# Patient Record
Sex: Female | Born: 1945 | Race: White | Hispanic: No | Marital: Married | Smoking: Never smoker
Health system: Southern US, Community
[De-identification: ages and names within clinical notes are randomized; demographics above are authoritative.]

## PROBLEM LIST (undated history)

## (undated) DIAGNOSIS — M1990 Unspecified osteoarthritis, unspecified site: Secondary | ICD-10-CM

## (undated) DIAGNOSIS — E785 Hyperlipidemia, unspecified: Secondary | ICD-10-CM

## (undated) DIAGNOSIS — I10 Essential (primary) hypertension: Secondary | ICD-10-CM

## (undated) DIAGNOSIS — Z9889 Other specified postprocedural states: Secondary | ICD-10-CM

## (undated) DIAGNOSIS — C50919 Malignant neoplasm of unspecified site of unspecified female breast: Secondary | ICD-10-CM

## (undated) DIAGNOSIS — R112 Nausea with vomiting, unspecified: Secondary | ICD-10-CM

## (undated) HISTORY — PX: MASTECTOMY: SHX3

## (undated) HISTORY — PX: BUNIONECTOMY: SHX129

## (undated) HISTORY — PX: BREAST LUMPECTOMY: SHX2

## (undated) HISTORY — PX: BREAST RECONSTRUCTION: SHX9

## (undated) HISTORY — PX: ABDOMINAL HYSTERECTOMY: SHX81

---

## 2018-09-01 ENCOUNTER — Ambulatory Visit
Admission: RE | Admit: 2018-09-01 | Discharge: 2018-09-01 | Disposition: A | Discharge status: Home / Self Care | Payer: Medicare HMO | Source: Ambulatory Visit | Attending: Physical Medicine and Rehabilitation | Admitting: Physical Medicine and Rehabilitation

## 2018-09-01 ENCOUNTER — Other Ambulatory Visit: Payer: Self-pay

## 2018-09-01 ENCOUNTER — Other Ambulatory Visit: Payer: Self-pay | Admitting: Physical Medicine and Rehabilitation

## 2018-09-01 DIAGNOSIS — M5416 Radiculopathy, lumbar region: Secondary | ICD-10-CM | POA: Diagnosis not present

## 2020-01-08 ENCOUNTER — Other Ambulatory Visit: Payer: Self-pay

## 2020-01-08 ENCOUNTER — Encounter: Payer: Self-pay | Admitting: Ophthalmology

## 2020-01-10 ENCOUNTER — Other Ambulatory Visit: Payer: Self-pay

## 2020-01-10 ENCOUNTER — Other Ambulatory Visit
Admission: RE | Admit: 2020-01-10 | Discharge: 2020-01-10 | Disposition: A | Discharge status: Home / Self Care | Payer: Medicare HMO | Source: Ambulatory Visit | Attending: Ophthalmology | Admitting: Ophthalmology

## 2020-01-10 DIAGNOSIS — Z20822 Contact with and (suspected) exposure to covid-19: Secondary | ICD-10-CM | POA: Insufficient documentation

## 2020-01-10 DIAGNOSIS — Z01812 Encounter for preprocedural laboratory examination: Secondary | ICD-10-CM | POA: Insufficient documentation

## 2020-01-10 LAB — SARS CORONAVIRUS 2 (TAT 6-24 HRS): SARS Coronavirus 2: NEGATIVE

## 2020-01-10 NOTE — Discharge Instructions (Signed)

## 2020-01-14 ENCOUNTER — Ambulatory Visit: Payer: Medicare HMO | Admitting: Anesthesiology

## 2020-01-14 ENCOUNTER — Other Ambulatory Visit: Payer: Self-pay

## 2020-01-14 ENCOUNTER — Encounter
Admission: RE | Disposition: A | Discharge status: Home / Self Care | Payer: Self-pay | Source: Home / Self Care | Attending: Ophthalmology

## 2020-01-14 ENCOUNTER — Encounter: Payer: Self-pay | Admitting: Ophthalmology

## 2020-01-14 ENCOUNTER — Ambulatory Visit
Admission: RE | Admit: 2020-01-14 | Discharge: 2020-01-14 | Disposition: A | Discharge status: Home / Self Care | Payer: Medicare HMO | Attending: Ophthalmology | Admitting: Ophthalmology

## 2020-01-14 DIAGNOSIS — M170 Bilateral primary osteoarthritis of knee: Secondary | ICD-10-CM | POA: Insufficient documentation

## 2020-01-14 DIAGNOSIS — Z853 Personal history of malignant neoplasm of breast: Secondary | ICD-10-CM | POA: Insufficient documentation

## 2020-01-14 DIAGNOSIS — Z79899 Other long term (current) drug therapy: Secondary | ICD-10-CM | POA: Insufficient documentation

## 2020-01-14 DIAGNOSIS — H2512 Age-related nuclear cataract, left eye: Secondary | ICD-10-CM | POA: Diagnosis present

## 2020-01-14 DIAGNOSIS — M479 Spondylosis, unspecified: Secondary | ICD-10-CM | POA: Diagnosis not present

## 2020-01-14 DIAGNOSIS — E785 Hyperlipidemia, unspecified: Secondary | ICD-10-CM | POA: Insufficient documentation

## 2020-01-14 DIAGNOSIS — I10 Essential (primary) hypertension: Secondary | ICD-10-CM | POA: Diagnosis not present

## 2020-01-14 DIAGNOSIS — Z6837 Body mass index (BMI) 37.0-37.9, adult: Secondary | ICD-10-CM | POA: Insufficient documentation

## 2020-01-14 DIAGNOSIS — E669 Obesity, unspecified: Secondary | ICD-10-CM | POA: Diagnosis not present

## 2020-01-14 HISTORY — DX: Essential (primary) hypertension: I10

## 2020-01-14 HISTORY — DX: Malignant neoplasm of unspecified site of unspecified female breast: C50.919

## 2020-01-14 HISTORY — DX: Other specified postprocedural states: R11.2

## 2020-01-14 HISTORY — DX: Other specified postprocedural states: Z98.890

## 2020-01-14 HISTORY — DX: Unspecified osteoarthritis, unspecified site: M19.90

## 2020-01-14 HISTORY — DX: Hyperlipidemia, unspecified: E78.5

## 2020-01-14 HISTORY — PX: CATARACT EXTRACTION W/PHACO: SHX586

## 2020-01-14 SURGERY — PHACOEMULSIFICATION, CATARACT, WITH IOL INSERTION
Anesthesia: Monitor Anesthesia Care | Site: Eye | Laterality: Left

## 2020-01-14 MED ORDER — TETRACAINE HCL 0.5 % OP SOLN
1.0000 [drp] | OPHTHALMIC | Status: DC | PRN
  Administered 2020-01-14 (×3): 1 [drp] via OPHTHALMIC

## 2020-01-14 MED ORDER — SODIUM HYALURONATE 23 MG/ML IO SOLN
INTRAOCULAR | Status: DC | PRN
  Administered 2020-01-14: 0.6 mL via INTRAOCULAR

## 2020-01-14 MED ORDER — EPINEPHRINE PF 1 MG/ML IJ SOLN
INTRAOCULAR | Status: DC | PRN
  Administered 2020-01-14: 86 mL via OPHTHALMIC

## 2020-01-14 MED ORDER — MIDAZOLAM HCL 2 MG/2ML IJ SOLN
INTRAMUSCULAR | Status: DC | PRN
  Administered 2020-01-14 (×2): 1 mg via INTRAVENOUS

## 2020-01-14 MED ORDER — LIDOCAINE HCL (PF) 2 % IJ SOLN
INTRAOCULAR | Status: DC | PRN
  Administered 2020-01-14: 1 mL via INTRAOCULAR

## 2020-01-14 MED ORDER — ARMC OPHTHALMIC DILATING DROPS
1.0000 "application " | OPHTHALMIC | Status: DC | PRN
  Administered 2020-01-14 (×3): 1 via OPHTHALMIC

## 2020-01-14 MED ORDER — MOXIFLOXACIN HCL 0.5 % OP SOLN
OPHTHALMIC | Status: DC | PRN
  Administered 2020-01-14: 0.2 mL via OPHTHALMIC

## 2020-01-14 MED ORDER — SODIUM HYALURONATE 10 MG/ML IO SOLN
INTRAOCULAR | Status: DC | PRN
  Administered 2020-01-14: 0.55 mL via INTRAOCULAR

## 2020-01-14 MED ORDER — LACTATED RINGERS IV SOLN: INTRAVENOUS | Status: DC

## 2020-01-14 SURGICAL SUPPLY — 19 items
CANNULA ANT/CHMB 27G (MISCELLANEOUS) ×2 IMPLANT
CANNULA ANT/CHMB 27GA (MISCELLANEOUS) ×4 IMPLANT
DISSECTOR HYDRO NUCLEUS 50X22 (MISCELLANEOUS) ×2 IMPLANT
GLOVE SURG LX 7.5 STRW (GLOVE) ×1
GLOVE SURG LX STRL 7.5 STRW (GLOVE) ×1 IMPLANT
GLOVE SURG SYN 8.5  E (GLOVE) ×1
GLOVE SURG SYN 8.5 E (GLOVE) ×1 IMPLANT
GLOVE SURG SYN 8.5 PF PI (GLOVE) ×1 IMPLANT
GOWN STRL REUS W/ TWL LRG LVL3 (GOWN DISPOSABLE) ×2 IMPLANT
GOWN STRL REUS W/TWL LRG LVL3 (GOWN DISPOSABLE) ×4
LENS IOL TECNIS EYHANCE 19.5 (Intraocular Lens) ×1 IMPLANT
MARKER SKIN DUAL TIP RULER LAB (MISCELLANEOUS) ×2 IMPLANT
PACK DR. KING ARMS (PACKS) ×2 IMPLANT
PACK EYE AFTER SURG (MISCELLANEOUS) ×2 IMPLANT
PACK OPTHALMIC (MISCELLANEOUS) ×2 IMPLANT
SYR 3ML LL SCALE MARK (SYRINGE) ×2 IMPLANT
SYR TB 1ML LUER SLIP (SYRINGE) ×2 IMPLANT
WATER STERILE IRR 250ML POUR (IV SOLUTION) ×2 IMPLANT
WIPE NON LINTING 3.25X3.25 (MISCELLANEOUS) ×2 IMPLANT

## 2020-01-14 NOTE — Op Note (Signed)
OPERATIVE NOTE  Melanie Malone [id number] 01/14/2020   PREOPERATIVE DIAGNOSIS:  Nuclear sclerotic cataract left eye.  H25.12   POSTOPERATIVE DIAGNOSIS:    Nuclear sclerotic cataract left eye.     PROCEDURE:  Phacoemusification with posterior chamber intraocular lens placement of the left eye   LENS:   Implant Name Type Inv. Item Serial No. Manufacturer Lot No. LRB No. Used Action  LENS II EYHANCE 19.5 - [id number] Intraocular Lens LENS II EYHANCE 19.5 [id number] JOHNSON   Left 1 Implanted      Procedure(s) with comments: CATARACT EXTRACTION PHACO AND INTRAOCULAR LENS PLACEMENT (IOC) LEFT (Left) - 6.92 0:54.0  DIB00 +19.5   ULTRASOUND TIME: 0 minutes 54 seconds.  CDE 6.92   SURGEON:  Benay Pillow, MD, MPH   ANESTHESIA:  Topical with tetracaine drops augmented with 1% preservative-free intracameral lidocaine.  ESTIMATED BLOOD LOSS: <1 mL   COMPLICATIONS:  None.   DESCRIPTION OF PROCEDURE:  The patient was identified in the holding room and transported to the operating room and placed in the supine position under the operating microscope.  The left eye was identified as the operative eye and it was prepped and draped in the usual sterile ophthalmic fashion.   A 1.0 millimeter clear-corneal paracentesis was made at the 5:00 position. 0.5 ml of preservative-free 1% lidocaine with epinephrine was injected into the anterior chamber.  The anterior chamber was filled with Healon 5 viscoelastic.  A 2.4 millimeter keratome was used to make a near-clear corneal incision at the 2:00 position.  A curvilinear capsulorrhexis was made with a cystotome and capsulorrhexis forceps.  Balanced salt solution was used to hydrodissect and hydrodelineate the nucleus.   Phacoemulsification was then used in stop and chop fashion to remove the lens nucleus and epinucleus.  The remaining cortex was then removed using the irrigation and aspiration handpiece. Healon was then placed into the capsular bag to  distend it for lens placement.  A lens was then injected into the capsular bag.  The remaining viscoelastic was aspirated.   Wounds were hydrated with balanced salt solution.  The anterior chamber was inflated to a physiologic pressure with balanced salt solution.  Intracameral vigamox 0.1 mL undiltued was injected into the eye and a drop placed onto the ocular surface.  No wound leaks were noted.  The patient was taken to the recovery room in stable condition without complications of anesthesia or surgery  Benay Pillow 01/14/2020, 11:55 AM

## 2020-01-14 NOTE — Transfer of Care (Signed)
Immediate Anesthesia Transfer of Care Note  Patient: Melanie Malone  Procedure(s) Performed: CATARACT EXTRACTION PHACO AND INTRAOCULAR LENS PLACEMENT (IOC) LEFT (Left Eye)  Patient Location: PACU  Anesthesia Type: MAC  Level of Consciousness: awake, alert  and patient cooperative  Airway and Oxygen Therapy: Patient Spontanous Breathing and Patient connected to supplemental oxygen  Post-op Assessment: Post-op Vital signs reviewed, Patient's Cardiovascular Status Stable, Respiratory Function Stable, Patent Airway and No signs of Nausea or vomiting  Post-op Vital Signs: Reviewed and stable  Complications: No complications documented.

## 2020-01-14 NOTE — H&P (Signed)
[facility]   Primary Care Physician:  Dion Body, MD Ophthalmologist: Dr. Benay Pillow  Pre-Procedure History & Physical: HPI:  Melanie Malone is a 74 y.o. female here for cataract surgery.   Past Medical History:  Diagnosis Date  . Arthritis    knees, lower back  . Breast CA ([place])   . Hyperlipidemia   . Hypertension   . PONV (postoperative nausea and vomiting)     Past Surgical History:  Procedure Laterality Date  . ABDOMINAL HYSTERECTOMY    . BREAST LUMPECTOMY    . BREAST RECONSTRUCTION Bilateral   . BUNIONECTOMY Right   . MASTECTOMY Bilateral     Prior to Admission medications   Medication Sig Start Date End Date Taking? Authorizing Provider  acetaminophen (TYLENOL) 500 MG tablet Take 500 mg by mouth 2 (two) times daily as needed.   Yes [provider]  GLUCOSAMINE-CHONDROITIN PO Take by mouth daily. [place] Tamaflex   Yes [provider]  hydrochlorothiazide (HYDRODIURIL) 25 MG tablet Take 25 mg by mouth daily.   Yes [provider]  ibuprofen (ADVIL) 800 MG tablet Take 800 mg by mouth 2 (two) times daily as needed.   Yes [provider]  lisinopril (ZESTRIL) 20 MG tablet Take 20 mg by mouth daily.   Yes [provider]  simvastatin (ZOCOR) 40 MG tablet Take 40 mg by mouth daily.   Yes [provider]  vitamin B-12 (CYANOCOBALAMIN) 500 MCG tablet Take 500 mcg by mouth daily.   Yes [provider]  Vitamin D3 (VITAMIN D) 25 MCG tablet Take 1,000 Units by mouth daily.   Yes [provider]  Melatonin 10 MG TABS Take by mouth at bedtime. Patient not taking: Reported on 01/14/2020    [provider]    Allergies as of 12/27/2019  . (Not on File)    History reviewed. No pertinent family history.  Social History   Socioeconomic History  . Marital status: Married    Spouse name: Not on file  . Number of children: Not on file  . Years of education: Not on file  . Highest  education level: Not on file  Occupational History  . Not on file  Tobacco Use  . Smoking status: Never Smoker  . Smokeless tobacco: Never Used  Vaping Use  . Vaping Use: Never used  Substance and Sexual Activity  . Alcohol use: Not Currently  . Drug use: Not on file  . Sexual activity: Not on file  Other Topics Concern  . Not on file  Social History Narrative  . Not on file   Social Determinants of Health   Financial Resource Strain:   . Difficulty of Paying Living Expenses: Not on file  Food Insecurity:   . Worried About Charity fundraiser in the Last Year: Not on file  . Ran Out of Food in the Last Year: Not on file  Transportation Needs:   . Lack of Transportation (Medical): Not on file  . Lack of Transportation (Non-Medical): Not on file  Physical Activity:   . Days of Exercise per Week: Not on file  . Minutes of Exercise per Session: Not on file  Stress:   . Feeling of Stress : Not on file  Social Connections:   . Frequency of Communication with Friends and Family: Not on file  . Frequency of Social Gatherings with Friends and Family: Not on file  . Attends Religious Services: Not on file  . Active Member of  Clubs or Organizations: Not on file  . Attends Archivist Meetings: Not on file  . Marital Status: Not on file  Intimate Partner Violence:   . Fear of Current or Ex-Partner: Not on file  . Emotionally Abused: Not on file  . Physically Abused: Not on file  . Sexually Abused: Not on file    Review of Systems: See HPI, otherwise negative ROS  Physical Exam: BP (!) 130/94   Pulse 79   Temp (!) 97.5 F (36.4 C) (Temporal)   Resp 16   Ht 5\' 4"  (1.626 m)   Wt 98.7 kg   SpO2 97%   BMI 37.33 kg/m  General:   Alert,  pleasant and cooperative in NAD Head:  Normocephalic and atraumatic.  Impression/Plan: Melanie Malone is here for cataract surgery.  Risks, benefits, limitations, and alternatives regarding cataract surgery have been reviewed  with the patient.  Questions have been answered.  All parties agreeable.   Benay Pillow, MD  01/14/2020, 11:19 AM

## 2020-01-14 NOTE — Anesthesia Procedure Notes (Addendum)
Procedure Name: MAC Date/Time: 01/14/2020 11:30 AM Performed by: Georga Bora, CRNA Pre-anesthesia Checklist: Patient identified, Emergency Drugs available, Suction available, Patient being monitored and Timeout performed Oxygen Delivery Method: Nasal cannula Preoxygenation: Pre-oxygenation with 100% oxygen

## 2020-01-14 NOTE — Anesthesia Postprocedure Evaluation (Signed)
Anesthesia Post Note  Patient: Melanie Malone  Procedure(s) Performed: CATARACT EXTRACTION PHACO AND INTRAOCULAR LENS PLACEMENT (IOC) LEFT (Left Eye)     Patient location during evaluation: PACU Anesthesia Type: MAC Level of consciousness: awake and alert Pain management: pain level controlled Vital Signs Assessment: post-procedure vital signs reviewed and stable Respiratory status: spontaneous breathing Cardiovascular status: blood pressure returned to baseline Postop Assessment: no apparent nausea or vomiting, adequate PO intake and no headache Anesthetic complications: no   No complications documented.  Adele Barthel Danyia Borunda

## 2020-01-14 NOTE — Anesthesia Preprocedure Evaluation (Signed)
Anesthesia Evaluation  Patient identified by MRN, date of birth, ID band Patient awake    History of Anesthesia Complications (+) PONV and history of anesthetic complications  Airway Mallampati: II  TM Distance: >3 FB Neck ROM: Full    Dental no notable dental hx.    Pulmonary neg pulmonary ROS,    Pulmonary exam normal        Cardiovascular Exercise Tolerance: Good hypertension, Pt. on medications Normal cardiovascular exam     Neuro/Psych negative neurological ROS     GI/Hepatic negative GI ROS,   Endo/Other  Obesity (BMI 37)  Renal/GU negative Renal ROS     Musculoskeletal   Abdominal   Peds  Hematology negative hematology ROS (+)   Anesthesia Other Findings   Reproductive/Obstetrics                             Anesthesia Physical Anesthesia Plan  ASA: II  Anesthesia Plan: MAC   Post-op Pain Management:    Induction: Intravenous  PONV Risk Score and Plan: 3 and TIVA, Midazolam and Treatment may vary due to age or medical condition  Airway Management Planned: Nasal Cannula and Natural Airway  Additional Equipment: None  Intra-op Plan:   Post-operative Plan:   Informed Consent: I have reviewed the patients History and Physical, chart, labs and discussed the procedure including the risks, benefits and alternatives for the proposed anesthesia with the patient or authorized representative who has indicated his/her understanding and acceptance.       Plan Discussed with: CRNA  Anesthesia Plan Comments:         Anesthesia Quick Evaluation

## 2020-01-15 ENCOUNTER — Encounter: Payer: Self-pay | Admitting: Ophthalmology

## 2020-02-05 ENCOUNTER — Encounter: Payer: Self-pay | Admitting: Ophthalmology

## 2020-02-05 ENCOUNTER — Other Ambulatory Visit: Payer: Self-pay

## 2020-02-07 ENCOUNTER — Other Ambulatory Visit: Payer: Self-pay

## 2020-02-07 ENCOUNTER — Other Ambulatory Visit
Admission: RE | Admit: 2020-02-07 | Discharge: 2020-02-07 | Disposition: A | Discharge status: Home / Self Care | Payer: Medicare HMO | Source: Ambulatory Visit | Attending: Ophthalmology | Admitting: Ophthalmology

## 2020-02-07 DIAGNOSIS — Z01818 Encounter for other preprocedural examination: Secondary | ICD-10-CM | POA: Diagnosis present

## 2020-02-07 DIAGNOSIS — Z20822 Contact with and (suspected) exposure to covid-19: Secondary | ICD-10-CM | POA: Diagnosis not present

## 2020-02-07 LAB — SARS CORONAVIRUS 2 (TAT 6-24 HRS): SARS Coronavirus 2: NEGATIVE

## 2020-02-07 NOTE — Discharge Instructions (Signed)

## 2020-02-11 ENCOUNTER — Other Ambulatory Visit: Payer: Self-pay

## 2020-02-11 ENCOUNTER — Encounter
Admission: RE | Disposition: A | Discharge status: Home / Self Care | Payer: Self-pay | Source: Home / Self Care | Attending: Ophthalmology

## 2020-02-11 ENCOUNTER — Ambulatory Visit: Payer: Medicare HMO | Admitting: Anesthesiology

## 2020-02-11 ENCOUNTER — Encounter: Payer: Self-pay | Admitting: Ophthalmology

## 2020-02-11 ENCOUNTER — Ambulatory Visit
Admission: RE | Admit: 2020-02-11 | Discharge: 2020-02-11 | Disposition: A | Discharge status: Home / Self Care | Payer: Medicare HMO | Attending: Ophthalmology | Admitting: Ophthalmology

## 2020-02-11 DIAGNOSIS — Z853 Personal history of malignant neoplasm of breast: Secondary | ICD-10-CM | POA: Diagnosis not present

## 2020-02-11 DIAGNOSIS — H2511 Age-related nuclear cataract, right eye: Secondary | ICD-10-CM | POA: Diagnosis present

## 2020-02-11 DIAGNOSIS — Z79899 Other long term (current) drug therapy: Secondary | ICD-10-CM | POA: Diagnosis not present

## 2020-02-11 HISTORY — PX: CATARACT EXTRACTION W/PHACO: SHX586

## 2020-02-11 SURGERY — PHACOEMULSIFICATION, CATARACT, WITH IOL INSERTION
Anesthesia: Monitor Anesthesia Care | Site: Eye | Laterality: Right

## 2020-02-11 MED ORDER — FENTANYL CITRATE (PF) 100 MCG/2ML IJ SOLN
INTRAMUSCULAR | Status: DC | PRN
Start: 2020-02-11 — End: 2020-02-11
  Administered 2020-02-11: 50 ug via INTRAVENOUS

## 2020-02-11 MED ORDER — ARMC OPHTHALMIC DILATING DROPS
1.0000 "application " | OPHTHALMIC | Status: DC | PRN
  Administered 2020-02-11 (×3): 1 via OPHTHALMIC

## 2020-02-11 MED ORDER — LIDOCAINE HCL (PF) 2 % IJ SOLN
INTRAOCULAR | Status: DC | PRN
  Administered 2020-02-11: 1 mL via INTRAOCULAR

## 2020-02-11 MED ORDER — ACETAMINOPHEN 325 MG PO TABS: 325.0000 mg | ORAL_TABLET | Freq: Once | ORAL | Status: DC

## 2020-02-11 MED ORDER — MIDAZOLAM HCL 2 MG/2ML IJ SOLN
INTRAMUSCULAR | Status: DC | PRN
  Administered 2020-02-11: 1 mg via INTRAVENOUS

## 2020-02-11 MED ORDER — SODIUM HYALURONATE 10 MG/ML IO SOLN
INTRAOCULAR | Status: DC | PRN
  Administered 2020-02-11: 0.55 mL via INTRAOCULAR

## 2020-02-11 MED ORDER — ONDANSETRON HCL 4 MG/2ML IJ SOLN
INTRAMUSCULAR | Status: DC | PRN
  Administered 2020-02-11: 4 mg via INTRAVENOUS

## 2020-02-11 MED ORDER — LACTATED RINGERS IV SOLN: INTRAVENOUS | Status: DC

## 2020-02-11 MED ORDER — MOXIFLOXACIN HCL 0.5 % OP SOLN
OPHTHALMIC | Status: DC | PRN
  Administered 2020-02-11: 0.2 mL via OPHTHALMIC

## 2020-02-11 MED ORDER — ACETAMINOPHEN 160 MG/5ML PO SOLN: 325.0000 mg | Freq: Once | ORAL | Status: DC

## 2020-02-11 MED ORDER — TETRACAINE HCL 0.5 % OP SOLN
1.0000 [drp] | OPHTHALMIC | Status: DC | PRN
  Administered 2020-02-11 (×3): 1 [drp] via OPHTHALMIC

## 2020-02-11 MED ORDER — EPINEPHRINE PF 1 MG/ML IJ SOLN
INTRAOCULAR | Status: DC | PRN
  Administered 2020-02-11: 112 mL via OPHTHALMIC

## 2020-02-11 MED ORDER — SODIUM HYALURONATE 23 MG/ML IO SOLN
INTRAOCULAR | Status: DC | PRN
  Administered 2020-02-11: 0.6 mL via INTRAOCULAR

## 2020-02-11 SURGICAL SUPPLY — 17 items
CANNULA ANT/CHMB 27GA (MISCELLANEOUS) ×6 IMPLANT
DISSECTOR HYDRO NUCLEUS 50X22 (MISCELLANEOUS) ×3 IMPLANT
GLOVE SURG LX 7.5 STRW (GLOVE) ×2
GLOVE SURG LX STRL 7.5 STRW (GLOVE) ×1 IMPLANT
GLOVE SURG SYN 8.5  E (GLOVE) ×2
GLOVE SURG SYN 8.5 E (GLOVE) ×1 IMPLANT
GOWN STRL REUS W/ TWL LRG LVL3 (GOWN DISPOSABLE) ×2 IMPLANT
GOWN STRL REUS W/TWL LRG LVL3 (GOWN DISPOSABLE) ×6
LENS IOL TECNIS EYHANCE 19.5 (Intraocular Lens) ×3 IMPLANT
MARKER SKIN DUAL TIP RULER LAB (MISCELLANEOUS) ×3 IMPLANT
PACK DR. KING ARMS (PACKS) ×3 IMPLANT
PACK EYE AFTER SURG (MISCELLANEOUS) ×3 IMPLANT
PACK OPTHALMIC (MISCELLANEOUS) ×3 IMPLANT
SYR 3ML LL SCALE MARK (SYRINGE) ×3 IMPLANT
SYR TB 1ML LUER SLIP (SYRINGE) ×3 IMPLANT
WATER STERILE IRR 250ML POUR (IV SOLUTION) ×3 IMPLANT
WIPE NON LINTING 3.25X3.25 (MISCELLANEOUS) ×3 IMPLANT

## 2020-02-11 NOTE — Anesthesia Procedure Notes (Signed)
Date/Time: 02/11/2020 11:40 AM Performed by: Dionne Bucy, CRNA Pre-anesthesia Checklist: Patient identified, Emergency Drugs available, Suction available, Patient being monitored and Timeout performed Oxygen Delivery Method: Nasal cannula Placement Confirmation: positive ETCO2

## 2020-02-11 NOTE — Anesthesia Preprocedure Evaluation (Signed)
Anesthesia Evaluation  Patient identified by MRN, date of birth, ID band Patient awake    Reviewed: Allergy & Precautions, H&P , NPO status , Patient's Chart, lab work & pertinent test results  History of Anesthesia Complications (+) PONV and history of anesthetic complications  Airway Mallampati: II  TM Distance: >3 FB Neck ROM: full    Dental no notable dental hx.    Pulmonary neg pulmonary ROS,    Pulmonary exam normal breath sounds clear to auscultation       Cardiovascular Exercise Tolerance: Good hypertension, Pt. on medications Normal cardiovascular exam Rhythm:regular Rate:Normal     Neuro/Psych negative neurological ROS     GI/Hepatic negative GI ROS,   Endo/Other  Obesity (BMI 37)  Renal/GU negative Renal ROS     Musculoskeletal   Abdominal   Peds  Hematology negative hematology ROS (+)   Anesthesia Other Findings   Reproductive/Obstetrics                             Anesthesia Physical  Anesthesia Plan  ASA: II  Anesthesia Plan: MAC   Post-op Pain Management:    Induction: Intravenous  PONV Risk Score and Plan: 3 and Treatment may vary due to age or medical condition, TIVA, Midazolam and Ondansetron  Airway Management Planned: Nasal Cannula and Natural Airway  Additional Equipment: None  Intra-op Plan:   Post-operative Plan:   Informed Consent: I have reviewed the patients History and Physical, chart, labs and discussed the procedure including the risks, benefits and alternatives for the proposed anesthesia with the patient or authorized representative who has indicated his/her understanding and acceptance.     Dental Advisory Given  Plan Discussed with: CRNA  Anesthesia Plan Comments:         Anesthesia Quick Evaluation

## 2020-02-11 NOTE — Transfer of Care (Signed)
Immediate Anesthesia Transfer of Care Note  Patient: Melanie Malone  Procedure(s) Performed: CATARACT EXTRACTION PHACO AND INTRAOCULAR LENS PLACEMENT (IOC) RIGHT 13.20 01:13.6 (Right Eye)  Patient Location: PACU  Anesthesia Type: MAC  Level of Consciousness: awake, alert  and patient cooperative  Airway and Oxygen Therapy: Patient Spontanous Breathing and Patient connected to supplemental oxygen  Post-op Assessment: Post-op Vital signs reviewed, Patient's Cardiovascular Status Stable, Respiratory Function Stable, Patent Airway and No signs of Nausea or vomiting  Post-op Vital Signs: Reviewed and stable  Complications: No complications documented.

## 2020-02-11 NOTE — Anesthesia Postprocedure Evaluation (Signed)
Anesthesia Post Note  Patient: Melanie Malone  Procedure(s) Performed: CATARACT EXTRACTION PHACO AND INTRAOCULAR LENS PLACEMENT (IOC) RIGHT 13.20 01:13.6 (Right Eye)     Patient location during evaluation: PACU Anesthesia Type: MAC Level of consciousness: awake and alert and oriented Pain management: satisfactory to patient Vital Signs Assessment: post-procedure vital signs reviewed and stable Respiratory status: spontaneous breathing, nonlabored ventilation and respiratory function stable Cardiovascular status: blood pressure returned to baseline and stable Postop Assessment: Adequate PO intake and No signs of nausea or vomiting Anesthetic complications: no   No complications documented.  Raliegh Ip

## 2020-02-11 NOTE — Op Note (Signed)
OPERATIVE NOTE  Melanie Malone [id number] 02/11/2020   PREOPERATIVE DIAGNOSIS:  Nuclear sclerotic cataract right eye.  H25.11   POSTOPERATIVE DIAGNOSIS:    Nuclear sclerotic cataract right eye.     PROCEDURE:  Phacoemusification with posterior chamber intraocular lens placement of the right eye   LENS:   Implant Name Type Inv. Item Serial No. Manufacturer Lot No. LRB No. Used Action  LENS IOL TECNIS EYHANCE 19.5 - [id number] Intraocular Lens LENS IOL TECNIS EYHANCE 19.5 [id number] JOHNSON   Right 1 Implanted       Procedure(s): CATARACT EXTRACTION PHACO AND INTRAOCULAR LENS PLACEMENT (IOC) RIGHT 13.20 01:13.6 (Right)  DIB00 +19.5   ULTRASOUND TIME: 1 minutes 13 seconds.  CDE 13.20   SURGEON:  Benay Pillow, MD, MPH  ANESTHESIOLOGIST: Anesthesiologist: Ronelle Nigh, MD CRNA: Dionne Bucy, CRNA   ANESTHESIA:  Topical with tetracaine drops augmented with 1% preservative-free intracameral lidocaine.  ESTIMATED BLOOD LOSS: less than 1 mL.   COMPLICATIONS:  None.   DESCRIPTION OF PROCEDURE:  The patient was identified in the holding room and transported to the operating room and placed in the supine position under the operating microscope.  The right eye was identified as the operative eye and it was prepped and draped in the usual sterile ophthalmic fashion.   A 1.0 millimeter clear-corneal paracentesis was made at the 10:30 position. 0.5 ml of preservative-free 1% lidocaine with epinephrine was injected into the anterior chamber.  The anterior chamber was filled with Healon 5 viscoelastic.  A 2.4 millimeter keratome was used to make a near-clear corneal incision at the 8:00 position.  A curvilinear capsulorrhexis was made with a cystotome and capsulorrhexis forceps.  Balanced salt solution was used to hydrodissect and hydrodelineate the nucleus.   Phacoemulsification was then used in stop and chop fashion to remove the lens nucleus and epinucleus.  The remaining cortex was then  removed using the irrigation and aspiration handpiece. Healon was then placed into the capsular bag to distend it for lens placement.  A lens was then injected into the capsular bag.  The remaining viscoelastic was aspirated.   Wounds were hydrated with balanced salt solution.  The anterior chamber was inflated to a physiologic pressure with balanced salt solution.   Intracameral vigamox 0.1 mL undiluted was injected into the eye and a drop placed onto the ocular surface.  No wound leaks were noted.  The patient was taken to the recovery room in stable condition without complications of anesthesia or surgery  Benay Pillow 02/11/2020, 12:03 PM

## 2020-02-11 NOTE — H&P (Signed)
[facility]   Primary Care Physician:  Dion Body, MD Ophthalmologist: Dr. Benay Pillow  Pre-Procedure History & Physical: HPI:  Melanie Malone is a 74 y.o. female here for cataract surgery.   Past Medical History:  Diagnosis Date  . Arthritis    knees, lower back  . Breast CA ([place])   . Hyperlipidemia   . Hypertension   . PONV (postoperative nausea and vomiting)     Past Surgical History:  Procedure Laterality Date  . ABDOMINAL HYSTERECTOMY    . BREAST LUMPECTOMY    . BREAST RECONSTRUCTION Bilateral   . BUNIONECTOMY Right   . CATARACT EXTRACTION W/PHACO Left 01/14/2020   Procedure: CATARACT EXTRACTION PHACO AND INTRAOCULAR LENS PLACEMENT ([place]) LEFT;  Surgeon: Eulogio Bear, MD;  Location: [place];  Service: Ophthalmology;  Laterality: Left;  6.92 0:54.0  . MASTECTOMY Bilateral     Prior to Admission medications   Medication Sig Start Date End Date Taking? Authorizing Provider  acetaminophen (TYLENOL) 500 MG tablet Take 500 mg by mouth 2 (two) times daily as needed.   Yes [provider]  GLUCOSAMINE-CHONDROITIN PO Take by mouth daily. [place] Tamaflex   Yes [provider]  hydrochlorothiazide (HYDRODIURIL) 25 MG tablet Take 25 mg by mouth daily.   Yes [provider]  ibuprofen (ADVIL) 800 MG tablet Take 800 mg by mouth 2 (two) times daily as needed.   Yes [provider]  lisinopril (ZESTRIL) 20 MG tablet Take 20 mg by mouth daily.   Yes [provider]  Melatonin 10 MG TABS Take by mouth at bedtime.    Yes [provider]  simvastatin (ZOCOR) 40 MG tablet Take 40 mg by mouth daily.   Yes [provider]  vitamin B-12 (CYANOCOBALAMIN) 500 MCG tablet Take 500 mcg by mouth daily.   Yes [provider]  Vitamin D3 (VITAMIN D) 25 MCG tablet Take 1,000 Units by mouth daily.   Yes [provider]    Allergies as of 01/16/2020 - Review Complete 01/14/2020  Allergen  Reaction Noted  . Codeine Shortness Of Breath 01/08/2020  . Scallops [shellfish allergy] Nausea And Vomiting 01/08/2020  . Red dye Rash 01/08/2020    History reviewed. No pertinent family history.  Social History   Socioeconomic History  . Marital status: Married    Spouse name: Not on file  . Number of children: Not on file  . Years of education: Not on file  . Highest education level: Not on file  Occupational History  . Not on file  Tobacco Use  . Smoking status: Never Smoker  . Smokeless tobacco: Never Used  Vaping Use  . Vaping Use: Never used  Substance and Sexual Activity  . Alcohol use: Not Currently  . Drug use: Not on file  . Sexual activity: Not on file  Other Topics Concern  . Not on file  Social History Narrative  . Not on file   Social Determinants of Health   Financial Resource Strain:   . Difficulty of Paying Living Expenses: Not on file  Food Insecurity:   . Worried About Charity fundraiser in the Last Year: Not on file  . Ran Out of Food in the Last Year: Not on file  Transportation Needs:   . Lack of Transportation (Medical): Not on file  . Lack of Transportation (Non-Medical): Not on file  Physical Activity:   . Days of Exercise per Week: Not on file  . Minutes of Exercise  per Session: Not on file  Stress:   . Feeling of Stress : Not on file  Social Connections:   . Frequency of Communication with Friends and Family: Not on file  . Frequency of Social Gatherings with Friends and Family: Not on file  . Attends Religious Services: Not on file  . Active Member of Clubs or Organizations: Not on file  . Attends Archivist Meetings: Not on file  . Marital Status: Not on file  Intimate Partner Violence:   . Fear of Current or Ex-Partner: Not on file  . Emotionally Abused: Not on file  . Physically Abused: Not on file  . Sexually Abused: Not on file    Review of Systems: See HPI, otherwise negative ROS  Physical Exam: BP 134/81    Pulse 85   Temp (!) 97 F (36.1 C) (Temporal)   Resp 16   Ht 5\' 4"  (1.626 m)   Wt 99.3 kg   SpO2 98%   BMI 37.59 kg/m  General:   Alert,  pleasant and cooperative in NAD Head:  Normocephalic and atraumatic. Respiratory:  Normal work of breathing.  Impression/Plan: Melanie Malone is here for cataract surgery.  Risks, benefits, limitations, and alternatives regarding cataract surgery have been reviewed with the patient.  Questions have been answered.  All parties agreeable.   Benay Pillow, MD  02/11/2020, 11:31 AM

## 2020-02-12 ENCOUNTER — Encounter: Payer: Self-pay | Admitting: Ophthalmology

## 2020-08-27 ENCOUNTER — Emergency Department: Payer: Medicare HMO

## 2020-08-27 ENCOUNTER — Other Ambulatory Visit: Payer: Self-pay

## 2020-08-27 ENCOUNTER — Inpatient Hospital Stay
Admission: EM | Admit: 2020-08-27 | Discharge: 2020-09-03 | DRG: 478 | Disposition: A | Discharge status: Skilled Nursing Facility | Payer: Medicare HMO | Attending: Internal Medicine | Admitting: Internal Medicine

## 2020-08-27 DIAGNOSIS — Z79899 Other long term (current) drug therapy: Secondary | ICD-10-CM

## 2020-08-27 DIAGNOSIS — Z6836 Body mass index (BMI) 36.0-36.9, adult: Secondary | ICD-10-CM

## 2020-08-27 DIAGNOSIS — Z20822 Contact with and (suspected) exposure to covid-19: Secondary | ICD-10-CM | POA: Diagnosis present

## 2020-08-27 DIAGNOSIS — Z923 Personal history of irradiation: Secondary | ICD-10-CM

## 2020-08-27 DIAGNOSIS — Z853 Personal history of malignant neoplasm of breast: Secondary | ICD-10-CM

## 2020-08-27 DIAGNOSIS — E871 Hypo-osmolality and hyponatremia: Secondary | ICD-10-CM | POA: Diagnosis present

## 2020-08-27 DIAGNOSIS — Z9102 Food additives allergy status: Secondary | ICD-10-CM | POA: Diagnosis not present

## 2020-08-27 DIAGNOSIS — E785 Hyperlipidemia, unspecified: Secondary | ICD-10-CM | POA: Diagnosis present

## 2020-08-27 DIAGNOSIS — Z885 Allergy status to narcotic agent status: Secondary | ICD-10-CM

## 2020-08-27 DIAGNOSIS — S7221XA Displaced subtrochanteric fracture of right femur, initial encounter for closed fracture: Secondary | ICD-10-CM | POA: Diagnosis present

## 2020-08-27 DIAGNOSIS — L8990 Pressure ulcer of unspecified site, unspecified stage: Secondary | ICD-10-CM | POA: Insufficient documentation

## 2020-08-27 DIAGNOSIS — E876 Hypokalemia: Secondary | ICD-10-CM | POA: Diagnosis present

## 2020-08-27 DIAGNOSIS — Z91013 Allergy to seafood: Secondary | ICD-10-CM

## 2020-08-27 DIAGNOSIS — C7951 Secondary malignant neoplasm of bone: Secondary | ICD-10-CM | POA: Diagnosis present

## 2020-08-27 DIAGNOSIS — K5900 Constipation, unspecified: Secondary | ICD-10-CM | POA: Diagnosis present

## 2020-08-27 DIAGNOSIS — I700 Atherosclerosis of aorta: Secondary | ICD-10-CM | POA: Diagnosis present

## 2020-08-27 DIAGNOSIS — Z419 Encounter for procedure for purposes other than remedying health state, unspecified: Secondary | ICD-10-CM

## 2020-08-27 DIAGNOSIS — I10 Essential (primary) hypertension: Secondary | ICD-10-CM | POA: Diagnosis not present

## 2020-08-27 DIAGNOSIS — M159 Polyosteoarthritis, unspecified: Secondary | ICD-10-CM | POA: Diagnosis present

## 2020-08-27 DIAGNOSIS — I2510 Atherosclerotic heart disease of native coronary artery without angina pectoris: Secondary | ICD-10-CM | POA: Diagnosis present

## 2020-08-27 DIAGNOSIS — Z90710 Acquired absence of both cervix and uterus: Secondary | ICD-10-CM

## 2020-08-27 DIAGNOSIS — Z8249 Family history of ischemic heart disease and other diseases of the circulatory system: Secondary | ICD-10-CM | POA: Diagnosis not present

## 2020-08-27 DIAGNOSIS — S7221XD Displaced subtrochanteric fracture of right femur, subsequent encounter for closed fracture with routine healing: Secondary | ICD-10-CM | POA: Diagnosis not present

## 2020-08-27 DIAGNOSIS — L89152 Pressure ulcer of sacral region, stage 2: Secondary | ICD-10-CM | POA: Diagnosis present

## 2020-08-27 DIAGNOSIS — E669 Obesity, unspecified: Secondary | ICD-10-CM | POA: Diagnosis present

## 2020-08-27 DIAGNOSIS — M84451A Pathological fracture, right femur, initial encounter for fracture: Principal | ICD-10-CM | POA: Diagnosis present

## 2020-08-27 DIAGNOSIS — M899 Disorder of bone, unspecified: Secondary | ICD-10-CM | POA: Diagnosis present

## 2020-08-27 DIAGNOSIS — C50919 Malignant neoplasm of unspecified site of unspecified female breast: Secondary | ICD-10-CM | POA: Diagnosis not present

## 2020-08-27 DIAGNOSIS — Z9013 Acquired absence of bilateral breasts and nipples: Secondary | ICD-10-CM | POA: Diagnosis not present

## 2020-08-27 DIAGNOSIS — Z9221 Personal history of antineoplastic chemotherapy: Secondary | ICD-10-CM | POA: Diagnosis not present

## 2020-08-27 DIAGNOSIS — I119 Hypertensive heart disease without heart failure: Secondary | ICD-10-CM | POA: Diagnosis present

## 2020-08-27 LAB — CBC WITH DIFFERENTIAL/PLATELET
Abs Immature Granulocytes: 0.04 10*3/uL (ref 0.00–0.07)
Basophils Absolute: 0 10*3/uL (ref 0.0–0.1)
Basophils Relative: 0 %
Eosinophils Absolute: 0.1 10*3/uL (ref 0.0–0.5)
Eosinophils Relative: 1 %
HCT: 40 % (ref 36.0–46.0)
Hemoglobin: 13.6 g/dL (ref 12.0–15.0)
Immature Granulocytes: 0 %
Lymphocytes Relative: 18 %
Lymphs Abs: 1.9 10*3/uL (ref 0.7–4.0)
MCH: 30.4 pg (ref 26.0–34.0)
MCHC: 34 g/dL (ref 30.0–36.0)
MCV: 89.3 fL (ref 80.0–100.0)
Monocytes Absolute: 1 10*3/uL (ref 0.1–1.0)
Monocytes Relative: 9 %
Neutro Abs: 7.4 10*3/uL (ref 1.7–7.7)
Neutrophils Relative %: 72 %
Platelets: 335 10*3/uL (ref 150–400)
RBC: 4.48 MIL/uL (ref 3.87–5.11)
RDW: 13.2 % (ref 11.5–15.5)
WBC: 10.3 10*3/uL (ref 4.0–10.5)
nRBC: 0 % (ref 0.0–0.2)

## 2020-08-27 LAB — TYPE AND SCREEN
ABO/RH(D): A POS
Antibody Screen: NEGATIVE

## 2020-08-27 LAB — BASIC METABOLIC PANEL
Anion gap: 14 (ref 5–15)
BUN: 22 mg/dL (ref 8–23)
CO2: 21 mmol/L — ABNORMAL LOW (ref 22–32)
Calcium: 9.7 mg/dL (ref 8.9–10.3)
Chloride: 99 mmol/L (ref 98–111)
Creatinine, Ser: 0.76 mg/dL (ref 0.44–1.00)
GFR, Estimated: >60 mL/min (ref >60)
Glucose, Bld: 113 mg/dL — ABNORMAL HIGH (ref 70–99)
Potassium: 3.2 mmol/L — ABNORMAL LOW (ref 3.5–5.1)
Sodium: 134 mmol/L — ABNORMAL LOW (ref 135–145)

## 2020-08-27 LAB — RESP PANEL BY RT-PCR (FLU A&B, COVID) ARPGX2
Influenza A by PCR: NEGATIVE
Influenza B by PCR: NEGATIVE
SARS Coronavirus 2 by RT PCR: NEGATIVE

## 2020-08-27 MED ORDER — CEFAZOLIN SODIUM-DEXTROSE 2-4 GM/100ML-% IV SOLN
2.0000 g | Freq: Once | INTRAVENOUS | Status: AC
  Administered 2020-08-28: 2 g via INTRAVENOUS
  Filled 2020-08-27 (×2): qty 100

## 2020-08-27 MED ORDER — ACETAMINOPHEN 500 MG PO TABS: 500.0000 mg | ORAL_TABLET | Freq: Four times a day (QID) | ORAL | Status: DC | PRN

## 2020-08-27 MED ORDER — VITAMIN D 25 MCG (1000 UNIT) PO TABS
1000.0000 [IU] | ORAL_TABLET | Freq: Every day | ORAL | Status: DC
  Administered 2020-08-29 – 2020-09-03 (×6): 1000 [IU] via ORAL
  Filled 2020-08-27 (×6): qty 1

## 2020-08-27 MED ORDER — LISINOPRIL 20 MG PO TABS
20.0000 mg | ORAL_TABLET | Freq: Every day | ORAL | Status: DC
  Administered 2020-08-29 – 2020-09-03 (×6): 20 mg via ORAL
  Filled 2020-08-27 (×6): qty 1

## 2020-08-27 MED ORDER — IOHEXOL 300 MG/ML  SOLN
75.0000 mL | Freq: Once | INTRAMUSCULAR | Status: AC | PRN
  Administered 2020-08-27: 75 mL via INTRAVENOUS

## 2020-08-27 MED ORDER — ONDANSETRON HCL 4 MG/2ML IJ SOLN
4.0000 mg | Freq: Once | INTRAMUSCULAR | Status: AC
  Administered 2020-08-27: 4 mg via INTRAVENOUS
  Filled 2020-08-27: qty 2

## 2020-08-27 MED ORDER — HYDROMORPHONE HCL 1 MG/ML IJ SOLN
0.5000 mg | Freq: Once | INTRAMUSCULAR | Status: AC
  Administered 2020-08-27: 0.5 mg via INTRAVENOUS
  Filled 2020-08-27: qty 1

## 2020-08-27 MED ORDER — MELATONIN 5 MG PO TABS
10.0000 mg | ORAL_TABLET | Freq: Every day | ORAL | Status: DC
  Administered 2020-08-29: 10 mg via ORAL
  Filled 2020-08-27 (×5): qty 2

## 2020-08-27 MED ORDER — METHOCARBAMOL 1000 MG/10ML IJ SOLN
500.0000 mg | Freq: Four times a day (QID) | INTRAVENOUS | Status: DC | PRN
  Filled 2020-08-27: qty 5

## 2020-08-27 MED ORDER — HYDROCODONE-ACETAMINOPHEN 5-325 MG PO TABS
1.0000 | ORAL_TABLET | Freq: Four times a day (QID) | ORAL | Status: DC | PRN
  Filled 2020-08-27: qty 1

## 2020-08-27 MED ORDER — SENNA 8.6 MG PO TABS
1.0000 | ORAL_TABLET | Freq: Two times a day (BID) | ORAL | Status: DC
  Administered 2020-08-28 – 2020-09-02 (×10): 8.6 mg via ORAL
  Filled 2020-08-27 (×11): qty 1

## 2020-08-27 MED ORDER — METHOCARBAMOL 500 MG PO TABS
500.0000 mg | ORAL_TABLET | Freq: Four times a day (QID) | ORAL | Status: DC | PRN
  Administered 2020-08-27 – 2020-08-30 (×3): 500 mg via ORAL
  Filled 2020-08-27 (×4): qty 1

## 2020-08-27 MED ORDER — MORPHINE SULFATE (PF) 2 MG/ML IV SOLN
0.5000 mg | INTRAVENOUS | Status: DC | PRN
  Administered 2020-08-27 – 2020-08-30 (×10): 0.5 mg via INTRAVENOUS
  Filled 2020-08-27 (×10): qty 1

## 2020-08-27 MED ORDER — ONDANSETRON 4 MG PO TBDP
4.0000 mg | ORAL_TABLET | Freq: Three times a day (TID) | ORAL | Status: DC | PRN
  Administered 2020-08-27 – 2020-08-28 (×2): 4 mg via ORAL
  Filled 2020-08-27 (×3): qty 1

## 2020-08-27 MED ORDER — SIMVASTATIN 20 MG PO TABS
40.0000 mg | ORAL_TABLET | Freq: Every day | ORAL | Status: DC
  Administered 2020-08-27 – 2020-09-02 (×7): 40 mg via ORAL
  Filled 2020-08-27 (×7): qty 2

## 2020-08-27 MED ORDER — CYANOCOBALAMIN 500 MCG PO TABS
500.0000 ug | ORAL_TABLET | Freq: Every day | ORAL | Status: DC
  Administered 2020-08-29 – 2020-09-03 (×6): 500 ug via ORAL
  Filled 2020-08-27 (×7): qty 1

## 2020-08-27 MED ORDER — POTASSIUM CHLORIDE CRYS ER 20 MEQ PO TBCR
40.0000 meq | EXTENDED_RELEASE_TABLET | Freq: Once | ORAL | Status: DC
  Filled 2020-08-27: qty 2

## 2020-08-27 NOTE — H&P (Signed)
History and Physical    Melanie Malone AYT:[id number] DOB: 09/27/1945 DOA: 08/27/2020  PCP: Dion Body, MD   Patient coming from: Home  I have personally briefly reviewed patient's old medical records in [place]  Chief Complaint: Rt Leg Pain  HPI: Melanie Malone is a 75 y.o. female with medical history significant for breast cancer s/p double mastectomy, chemotherapy and radiation therapy and history of obesity who presents to the ER for evaluation of severe pain involving her right leg. Patient states that she was in her usual state of health and had gone to her brother in [place] to pick up their mail since they are on vacation. She was walking up the stairs and when she got to the third step her right leg gave way and she could not ambulate. She did not fall and was able to grab unto the rail and called out for help. Her husband was able to grab her a chair and she sat down before EMS was called. She has had pain in her right leg for weeks and recently drove to [place] with her husband. She attributes her pain to the long drive and is scheduled to follow up with an orthopedic surgeon for a steroid shot that she has had in the past which provided some pain relief. Labs have been reviewed and patient has hypokalemia Chest x ray reviewed by me shows right paratracheal prominence is noted which may be vascular in etiology, but CT scan of the chest with intravenous contrast is recommended to rule out adenopathy or other pathology. CT chest without contrast shows no paratracheal mass or lymphadenopathy. Some coronary artery calcification. Mild aortic atherosclerotic calcification. Small left pleural effusion layering dependently with dependent pulmonary atelectasis. 1 cm nodule or scar in the lateral left lower lobe, axial image 85. Scar is favored. Consider one of the following in 3 months for both low-risk and high-risk individuals: (a) repeat chest CT, (b) follow-up  PET-CT, or (c) tissue sampling.  CT scan of the right hip shows acute pathologic subtrochanteric fracture of the proximal right femur through a mixed lytic and sclerotic bone lesion. Additional predominantly lytic bony lesions throughout the visualized portion of the pelvis including large lytic lesion within the left iliac bone measuring approximately 7.0 cm. Findings are highly suspicious for metastatic disease given history of breast cancer. 12 Lead EKG reviewed by me shows sinus rhythm with Q waves in the lateral leads.  ED Course: Patient is a 75 year old female with a history of breast cancer s/p bilateral mastectomy, chemotherapy and radiation therapy who was brought in by EMS for evaluation of severe right hip pain which she rates a 10/10 in intensity at its worst. Patient denies falling and states that her right leg gave way. Imaging shows a right pathologic subtrochanteric femur fracture. Patient will be admitted to the hospital for further evaluation and surgical repair of her fracture.   Review of Systems: As per HPI otherwise all other systems reviewed and negative.    Past Medical History:  Diagnosis Date  . Arthritis    knees, lower back  . Breast CA ([place])   . Hyperlipidemia   . Hypertension   . PONV (postoperative nausea and vomiting)     Past Surgical History:  Procedure Laterality Date  . ABDOMINAL HYSTERECTOMY    . BREAST LUMPECTOMY    . BREAST RECONSTRUCTION Bilateral   . BUNIONECTOMY Right   . CATARACT EXTRACTION W/PHACO Left 01/14/2020   Procedure: CATARACT EXTRACTION PHACO  AND INTRAOCULAR LENS PLACEMENT (IOC) LEFT;  Surgeon: Eulogio Bear, MD;  Location: [facility];  Service: Ophthalmology;  Laterality: Left;  6.92 0:54.0  . CATARACT EXTRACTION W/PHACO Right 02/11/2020   Procedure: CATARACT EXTRACTION PHACO AND INTRAOCULAR LENS PLACEMENT (IOC) RIGHT 13.20 01:13.6;  Surgeon: Eulogio Bear, MD;  Location: [facility];  Service:  Ophthalmology;  Laterality: Right;  . MASTECTOMY Bilateral      reports that she has never smoked. She has never used smokeless tobacco. She reports previous alcohol use. No history on file for drug use.  Allergies  Allergen Reactions  . Codeine Shortness Of Breath, Anaphylaxis and Swelling    "Felt like I was having a heart attack"  . Scallops [Shellfish Allergy] Nausea And Vomiting  . Red Dye Rash    Family History  Problem Relation Age of Onset  . Hypertension Mother       Prior to Admission medications   Medication Sig Start Date End Date Taking? Authorizing Provider  acetaminophen (TYLENOL) 500 MG tablet Take 500 mg by mouth 2 (two) times daily as needed.   Yes [provider]  GLUCOSAMINE-CHONDROITIN PO Take by mouth daily. [place] Tamaflex   Yes [provider]  hydrochlorothiazide (HYDRODIURIL) 25 MG tablet Take 25 mg by mouth daily.   Yes [provider]  ibuprofen (ADVIL) 200 MG tablet Take 600 mg by mouth every 6 (six) hours as needed for moderate pain.   Yes [provider]  lisinopril (ZESTRIL) 20 MG tablet Take 20 mg by mouth daily.   Yes [provider]  simvastatin (ZOCOR) 40 MG tablet Take 40 mg by mouth daily.   Yes [provider]  vitamin B-12 (CYANOCOBALAMIN) 500 MCG tablet Take 500 mcg by mouth daily.   Yes [provider]  Vitamin D3 (VITAMIN D) 25 MCG tablet Take 1,000 Units by mouth daily.   Yes [provider]  Melatonin 10 MG TABS Take by mouth at bedtime.  Patient not taking: No sig reported    [provider]    Physical Exam: Vitals:   08/27/20 1317 08/27/20 1319 08/27/20 1448 08/27/20 1704  BP: 120/75  123/71 117/71  Pulse: 78  84 92  Resp: 16  16 18   Temp: 97.8 F (36.6 C)   98.3 F (36.8 C)  TempSrc: Oral   Oral  SpO2: 97%  99% 96%  Weight:  99.3 kg    Height:  5\' 5"  (1.651 m)       Vitals:   08/27/20 1317 08/27/20 1319 08/27/20 1448 08/27/20 1704  BP:  120/75  123/71 117/71  Pulse: 78  84 92  Resp: 16  16 18   Temp: 97.8 F (36.6 C)   98.3 F (36.8 C)  TempSrc: Oral   Oral  SpO2: 97%  99% 96%  Weight:  99.3 kg    Height:  5\' 5"  (1.651 m)        Constitutional: Alert and oriented x 3. Not in any apparent distress. Appears anxious HEENT:      Head: Normocephalic and atraumatic.         Eyes: PERLA, EOMI, Conjunctivae are normal. Sclera is non-icteric.       Mouth/Throat: Mucous membranes are moist.       Neck: Supple with no signs of meningismus. Cardiovascular: Regular rate and rhythm. No murmurs, gallops, or rubs. 2+ symmetrical distal pulses are present . No JVD. No LE edema Respiratory: Respiratory effort normal .Lungs sounds clear bilaterally. No  wheezes, crackles, or rhonchi.  Gastrointestinal: Soft, non tender, and non distended with positive bowel sounds.  Genitourinary: No CVA tenderness. Musculoskeletal: Decrease ROM right hip, Shortening of the right lower extremity  Neurologic:  Face is symmetric. Moving all extremities. No gross focal neurologic deficits  Skin: Skin is warm, dry.  No rash or ulcers Psychiatric: Mood and affect are normal   Labs on Admission: I have personally reviewed following labs and imaging studies  CBC: Recent Labs  Lab 08/27/20 1324  WBC 10.3  NEUTROABS 7.4  HGB 13.6  HCT 40.0  MCV 89.3  PLT 751   Basic Metabolic Panel: Recent Labs  Lab 08/27/20 1324  NA 134*  K 3.2*  CL 99  CO2 21*  GLUCOSE 113*  BUN 22  CREATININE 0.76  CALCIUM 9.7   GFR: Estimated Creatinine Clearance: 70.9 mL/min (by C-G formula based on SCr of 0.76 mg/dL). Liver Function Tests: No results for input(s): AST, ALT, ALKPHOS, BILITOT, PROT, ALBUMIN in the last 168 hours. No results for input(s): LIPASE, AMYLASE in the last 168 hours. No results for input(s): AMMONIA in the last 168 hours. Coagulation Profile: No results for input(s): INR, PROTIME in the last 168 hours. Cardiac Enzymes: No results for  input(s): CKTOTAL, CKMB, CKMBINDEX, TROPONINI in the last 168 hours. BNP (last 3 results) No results for input(s): PROBNP in the last 8760 hours. HbA1C: No results for input(s): HGBA1C in the last 72 hours. CBG: No results for input(s): GLUCAP in the last 168 hours. Lipid Profile: No results for input(s): CHOL, HDL, LDLCALC, TRIG, CHOLHDL, LDLDIRECT in the last 72 hours. Thyroid Function Tests: No results for input(s): TSH, T4TOTAL, FREET4, T3FREE, THYROIDAB in the last 72 hours. Anemia Panel: No results for input(s): VITAMINB12, FOLATE, FERRITIN, TIBC, IRON, RETICCTPCT in the last 72 hours. Urine analysis: No results found for: COLORURINE, APPEARANCEUR, LABSPEC, [place], GLUCOSEU, HGBUR, BILIRUBINUR, KETONESUR, PROTEINUR, UROBILINOGEN, NITRITE, LEUKOCYTESUR  Radiological Exams on Admission: DG Chest 1 View  Result Date: 08/27/2020 CLINICAL DATA:  Right hip pain after fall. EXAM: CHEST  1 VIEW COMPARISON:  None. FINDINGS: Normal cardiac size. Right paratracheal prominence is noted which may represent dilated aorta or other vascular structure, but adenopathy cannot be excluded. Both lungs are clear. The visualized skeletal structures are unremarkable. IMPRESSION: Right paratracheal prominence is noted which may be vascular in etiology, but CT scan of the chest with intravenous contrast is recommended to rule out adenopathy or other pathology. Electronically Signed   By: Marijo Conception M.D.   On: 08/27/2020 14:34   CT Chest W Contrast  Result Date: 08/27/2020 CLINICAL DATA:  Right paratracheal prominence at chest radiography. EXAM: CT CHEST WITH CONTRAST TECHNIQUE: Multidetector CT imaging of the chest was performed during intravenous contrast administration. CONTRAST:  61mL OMNIPAQUE IOHEXOL 300 MG/ML  SOLN COMPARISON:  Chest radiography same day FINDINGS: Cardiovascular: Heart size upper limits of normal. No pericardial fluid. Some coronary artery calcification. Some aortic atherosclerotic  calcification. No pulmonary artery pathology visible. Mediastinum/Nodes: No mediastinal or hilar mass or lymphadenopathy. Specifically, no right paratracheal adenopathy or mass. Lungs/Pleura: Minimal pulmonary scarring. No infiltrate or collapse. Small amount of pleural fluid or pleural thickening on the left with minimal dependent atelectasis at the left lung base. 1 cm nodule or focal scar in the lateral left lower lobe. Upper Abdomen: No significant upper abdominal finding. Musculoskeletal: Mild curvature in degenerative change of the thoracic spine. No evidence of thoracic fracture. No sternal fracture. No rib fracture. IMPRESSION: No paratracheal mass or  lymphadenopathy. Some coronary artery calcification. Mild aortic atherosclerotic calcification. Small left pleural effusion layering dependently with dependent pulmonary atelectasis. 1 cm nodule or scar in the lateral left lower lobe, axial image 85. Scar is favored. Consider one of the following in 3 months for both low-risk and high-risk individuals: (a) repeat chest CT, (b) follow-up PET-CT, or (c) tissue sampling. This recommendation follows the consensus statement: Guidelines for Management of Incidental Pulmonary Nodules Detected on CT Images: From the Fleischner Society 2017; Radiology 2017; 284:228-243. Aortic Atherosclerosis (ICD10-I70.0). Electronically Signed   By: Nelson Chimes M.D.   On: 08/27/2020 15:19   CT Hip Right Wo Contrast  Result Date: 08/27/2020 CLINICAL DATA:  Right femur fracture.  History of breast cancer EXAM: CT OF THE RIGHT HIP WITHOUT CONTRAST TECHNIQUE: Multidetector CT imaging of the right hip was performed according to the standard protocol. Multiplanar CT image reconstructions were also generated. COMPARISON:  X-ray 08/27/2020 FINDINGS: Bones/Joint/Cartilage Acute subtrochanteric fracture of the proximal right femur just along the inferior margin of the lesser trochanter. Prominent varus angulation at the fracture site.  Nondisplaced fracture extends through the lesser trochanter. Mixed lytic and sclerotic appearance of the underlying bone within this region suspicious for pathologic fracture (series 6, image 66-69). Endosteal scalloping noted along the posterior margin of the subtrochanteric left femur (series 2, image 84). No fracture involvement of the femoral neck or femoral head. Hip joint is intact without dislocation. There is a large lytic lesion within the left iliac bone measuring at least 7.0 cm in maximal dimension (series 6, images 77-96). Additional smaller lytic lesion within the right iliac bone adjacent to the right sacroiliac joint measuring up to 2.3 cm (series 6, image 80). Additional ill-defined area of patchy sclerosis within the anterior aspect of the right iliac crest, likely secondary to an underlying lesion (series 2, image 19). Ligaments Suboptimally assessed by CT. Muscles and Tendons No acute musculotendinous injury by CT. Right gluteus medius and minimus muscle atrophy. Soft tissues No soft tissue edema or fluid collection. No right inguinal lymphadenopathy. IMPRESSION: 1. Acute pathologic subtrochanteric fracture of the proximal right femur through a mixed lytic and sclerotic bone lesion. 2. Additional predominantly lytic bony lesions throughout the visualized portion of the pelvis including large lytic lesion within the left iliac bone measuring approximately 7.0 cm. Findings are highly suspicious for metastatic disease given history of breast cancer. Electronically Signed   By: Davina Poke D.O.   On: 08/27/2020 15:40   DG Hip Unilat  With Pelvis 2-3 Views Right  Addendum Date: 08/27/2020   ADDENDUM REPORT: 08/27/2020 15:50 ADDENDUM: Areas of sclerosis and lucency involving the left ilium. There are also areas of lucency involving the right trochanteric region. These findings are suggestive for bone lesions and pathologic fracture of the proximal right femur. These findings are better  characterized on the right hip CT. Electronically Signed   By: Markus Daft M.D.   On: 08/27/2020 15:50   Result Date: 08/27/2020 CLINICAL DATA:  Right hip pain and fall. EXAM: DG HIP (WITH OR WITHOUT PELVIS) 2-3V RIGHT COMPARISON:  None FINDINGS: Displaced and slightly angulated fracture of the proximal right femur. The fracture appears to be involving the subtrochanteric region although there may be a component in the right greater trochanter. Pelvic bony ring appears to be intact. Sclerosis and degenerative changes in lower lumbar spine. Normal appearance of the left hip on the pelvic view. Right hip joint is located. IMPRESSION: Displaced right subtrochanteric femur fracture with probable trochanteric extension.  Electronically Signed: By: Markus Daft M.D. On: 08/27/2020 14:33     Assessment/Plan Principal Problem:   Closed displaced subtrochanteric fracture of right femur (HCC) Active Problems:   Breast CA (HCC)   Obesity (BMI 30-39.9)   Hypertension     Pathologic Rt Femur Fracture Patient noted to have a pathologic femur fracture on imaging most likely from metastatic disease from known breast cancer to the bone Immobilize right lower extremity Pain control Place patient on muscle relaxants Consult orthopedic surgery    Hypertension Continue HCTZ and Lisinopril    Hypokalemia Related to diuretic use Supplement potassium    Obesity Complicates overall prognosis and care   DVT prophylaxis: SCD Code Status: full code Family Communication:  Greater than 50% of time was spent discussing patient's condition and plan of care with her and her husband at the bedside. All questions and concerns have been addressed. They verbalize understanding and agree with the plan. Disposition Plan: Back to previous home environment Consults called: Orthopedic surgery Status: Inpatient    Hasina Kreager MD [facility]     08/27/2020, 6:00 PM

## 2020-08-27 NOTE — ED Triage Notes (Addendum)
Pt to ED from home [facility] Pt c/o severe R leg pain Pt walking up stairs and R leg "gave out". Has been having severe R leg pain since then but also c/o R leg pain for past 2 weeks. Says R theigh hurts the most. L arm is restricted. Hx double masectomy and extensive lymph node removal on L side. Has 20g R hand from EMS. Received 129mcg fentanyl with EMS. CBG 148, EMS vitals normal.  Allergic to codeine: causes SOB Husband at bedside.  R leg appears shortened and externally rotated.

## 2020-08-27 NOTE — Consult Note (Signed)
Reason for Consult: Right proximal femur fracture Referring Physician: Dr. Arlyn Dunning is an 75 y.o. female.  HPI: Patient is a 75 year old has several days of prodromal hip pain and fractured without a fall.  She was brought to the emergency room where she is found to have a displaced subtrochanteric fracture with possibly this being a pathologic fracture.  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  Past Medical History:  Diagnosis Date  . Arthritis    knees, lower back  . Breast CA ([place])   . Hyperlipidemia   . Hypertension   . PONV (postoperative nausea and vomiting)     Past Surgical History:  Procedure Laterality Date  . ABDOMINAL HYSTERECTOMY    . BREAST LUMPECTOMY    . BREAST RECONSTRUCTION Bilateral   . BUNIONECTOMY Right   . CATARACT EXTRACTION W/PHACO Left 01/14/2020   Procedure: CATARACT EXTRACTION PHACO AND INTRAOCULAR LENS PLACEMENT ([place]) LEFT;  Surgeon: Eulogio Bear, MD;  Location: [facility];  Service: Ophthalmology;  Laterality: Left;  6.92 0:54.0  . CATARACT EXTRACTION W/PHACO Right 02/11/2020   Procedure: CATARACT EXTRACTION PHACO AND INTRAOCULAR LENS PLACEMENT (IOC) RIGHT 13.20 01:13.6;  Surgeon: Eulogio Bear, MD;  Location: [facility];  Service: Ophthalmology;  Laterality: Right;  . MASTECTOMY Bilateral     No family history on file.  Social History:  reports that she has never smoked. She has never used smokeless tobacco. She reports previous alcohol use. No history on file for drug use.  Allergies:  Allergies  Allergen Reactions  . Codeine Shortness Of Breath    "Felt like I was having a heart attack"  . Scallops [Shellfish Allergy] Nausea And Vomiting  . Red Dye Rash    Medications: I have reviewed the patient's current medications.  Results for orders placed or performed during the hospital encounter of 08/27/20 (from the past 48  hour(s))  CBC with Differential     Status: None   Collection Time: 08/27/20  1:24 PM  Result Value Ref Range   WBC 10.3 4.0 - 10.5 K/uL   RBC 4.48 3.87 - 5.11 MIL/uL   Hemoglobin 13.6 12.0 - 15.0 g/dL   HCT 40.0 36.0 - 46.0 %   MCV 89.3 80.0 - 100.0 fL   MCH 30.4 26.0 - 34.0 pg   MCHC 34.0 30.0 - 36.0 g/dL   RDW 13.2 11.5 - 15.5 %   Platelets 335 150 - 400 K/uL   nRBC 0.0 0.0 - 0.2 %   Neutrophils Relative % 72 %   Neutro Abs 7.4 1.7 - 7.7 K/uL   Lymphocytes Relative 18 %   Lymphs Abs 1.9 0.7 - 4.0 K/uL   Monocytes Relative 9 %   Monocytes Absolute 1.0 0.1 - 1.0 K/uL   Eosinophils Relative 1 %   Eosinophils Absolute 0.1 0.0 - 0.5 K/uL   Basophils Relative 0 %   Basophils Absolute 0.0 0.0 - 0.1 K/uL   Immature Granulocytes 0 %   Abs Immature Granulocytes 0.04 0.00 - 0.07 K/uL    Comment: Performed at [facility] [address], [place], [place] [address]  Basic metabolic panel     Status: Abnormal   Collection Time: 08/27/20  1:24 PM  Result Value Ref Range   Sodium 134 (L) 135 - 145 mmol/L   Potassium 3.2 (L) 3.5 - 5.1 mmol/L   Chloride 99 98 - 111 mmol/L  CO2 21 (L) 22 - 32 mmol/L   Glucose, Bld 113 (H) 70 - 99 mg/dL    Comment: Glucose reference range applies only to samples taken after fasting for at least 8 hours.   BUN 22 8 - 23 mg/dL   Creatinine, Ser 0.76 0.44 - 1.00 mg/dL   Calcium 9.7 8.9 - 10.3 mg/dL   GFR, Estimated >60 >60 mL/min    Comment: (NOTE) Calculated using the CKD-EPI Creatinine Equation (2021)    Anion gap 14 5 - 15    Comment: Performed at [facility], [address] [place], [place] [address]  Type and screen [place]     Status: None   Collection Time: 08/27/20  1:24 PM  Result Value Ref Range   ABO/RH(D) A POS    Antibody Screen NEG    Sample Expiration      08/30/2020,2359 Performed at [facility], [address], [place], [place] [address]     DG Chest 1 View  Result  Date: 08/27/2020 CLINICAL DATA:  Right hip pain after fall. EXAM: CHEST  1 VIEW COMPARISON:  None. FINDINGS: Normal cardiac size. Right paratracheal prominence is noted which may represent dilated aorta or other vascular structure, but adenopathy cannot be excluded. Both lungs are clear. The visualized skeletal structures are unremarkable. IMPRESSION: Right paratracheal prominence is noted which may be vascular in etiology, but CT scan of the chest with intravenous contrast is recommended to rule out adenopathy or other pathology. Electronically Signed   By: Marijo Conception M.D.   On: 08/27/2020 14:34   CT Chest W Contrast  Result Date: 08/27/2020 CLINICAL DATA:  Right paratracheal prominence at chest radiography. EXAM: CT CHEST WITH CONTRAST TECHNIQUE: Multidetector CT imaging of the chest was performed during intravenous contrast administration. CONTRAST:  32mL OMNIPAQUE IOHEXOL 300 MG/ML  SOLN COMPARISON:  Chest radiography same day FINDINGS: Cardiovascular: Heart size upper limits of normal. No pericardial fluid. Some coronary artery calcification. Some aortic atherosclerotic calcification. No pulmonary artery pathology visible. Mediastinum/Nodes: No mediastinal or hilar mass or lymphadenopathy. Specifically, no right paratracheal adenopathy or mass. Lungs/Pleura: Minimal pulmonary scarring. No infiltrate or collapse. Small amount of pleural fluid or pleural thickening on the left with minimal dependent atelectasis at the left lung base. 1 cm nodule or focal scar in the lateral left lower lobe. Upper Abdomen: No significant upper abdominal finding. Musculoskeletal: Mild curvature in degenerative change of the thoracic spine. No evidence of thoracic fracture. No sternal fracture. No rib fracture. IMPRESSION: No paratracheal mass or lymphadenopathy. Some coronary artery calcification. Mild aortic atherosclerotic calcification. Small left pleural effusion layering dependently with dependent pulmonary  atelectasis. 1 cm nodule or scar in the lateral left lower lobe, axial image 85. Scar is favored. Consider one of the following in 3 months for both low-risk and high-risk individuals: (a) repeat chest CT, (b) follow-up PET-CT, or (c) tissue sampling. This recommendation follows the consensus statement: Guidelines for Management of Incidental Pulmonary Nodules Detected on CT Images: From the Fleischner Society 2017; Radiology 2017; 284:228-243. Aortic Atherosclerosis (ICD10-I70.0). Electronically Signed   By: Nelson Chimes M.D.   On: 08/27/2020 15:19   DG Hip Unilat  With Pelvis 2-3 Views Right  Result Date: 08/27/2020 CLINICAL DATA:  Right hip pain and fall. EXAM: DG HIP (WITH OR WITHOUT PELVIS) 2-3V RIGHT COMPARISON:  None FINDINGS: Displaced and slightly angulated fracture of the proximal right femur. The fracture appears to be involving the subtrochanteric region although there may be  a component in the right greater trochanter. Pelvic bony ring appears to be intact. Sclerosis and degenerative changes in lower lumbar spine. Normal appearance of the left hip on the pelvic view. Right hip joint is located. IMPRESSION: Displaced right subtrochanteric femur fracture with probable trochanteric extension. Electronically Signed   By: Markus Daft M.D.   On: 08/27/2020 14:33    Review of Systems Blood pressure 123/71, pulse 84, temperature 97.8 F (36.6 C), temperature source Oral, resp. rate 16, height 5\' 5"  (1.651 m), weight 99.3 kg, SpO2 99 %. Physical Exam The right leg is held in flexion and extreme external rotation.  She has palpable pulses and skin is intact without ecchymosis Assessment/Plan: Displaced subtrochanteric hip fracture possibly pathologic with history of breast cancer.  Plan is for ORIF with probable biopsy if CT is suspicious for lesion with intramedullary device.  Site marked and discussed with the patient.  Hessie Knows 08/27/2020, 3:36 PM

## 2020-08-27 NOTE — ED Notes (Signed)
Dr Agbata at bedside. 

## 2020-08-27 NOTE — ED Notes (Signed)
Pt to xray

## 2020-08-27 NOTE — ED Provider Notes (Signed)
[facility] Emergency Department Provider Note  ____________________________________________   Event Date/Time   First MD Initiated Contact with Patient 08/27/20 1337     (approximate)  I have reviewed the triage vital signs and the nursing notes.   HISTORY  Chief Complaint R leg pain    HPI Melanie Malone is a 75 y.o. female here with right hip pain.  The patient states that over the last 3 weeks, she has had progressively worsening aching, throbbing, right knee and right hip pain.  She thought it was related to arthritis.  Earlier today, she was walking upstairs.  She planted her leg and then suddenly felt like her leg gave out.  She had severe, sharp, stabbing pain.  She has been unable to ambulate since then.  She describes aching pain with any kind of movement.  Pain is 10 out of 10 currently.  Denies any direct trauma to the area.  No history of prior trauma to the area.  No fevers or chills.  Of note, she does have remote history of breast cancer.  Denies known metastases.       Past Medical History:  Diagnosis Date  . Arthritis    knees, lower back  . Breast CA ([place])   . Hyperlipidemia   . Hypertension   . PONV (postoperative nausea and vomiting)     Patient Active Problem List   Diagnosis Date Noted  . Closed displaced subtrochanteric fracture of right femur ([place]) 08/27/2020    Past Surgical History:  Procedure Laterality Date  . ABDOMINAL HYSTERECTOMY    . BREAST LUMPECTOMY    . BREAST RECONSTRUCTION Bilateral   . BUNIONECTOMY Right   . CATARACT EXTRACTION W/PHACO Left 01/14/2020   Procedure: CATARACT EXTRACTION PHACO AND INTRAOCULAR LENS PLACEMENT ([place]) LEFT;  Surgeon: Eulogio Bear, MD;  Location: [place];  Service: Ophthalmology;  Laterality: Left;  6.92 0:54.0  . CATARACT EXTRACTION W/PHACO Right 02/11/2020   Procedure: CATARACT EXTRACTION PHACO AND INTRAOCULAR LENS PLACEMENT ([facility]) RIGHT 13.20 01:13.6;  Surgeon:  Eulogio Bear, MD;  Location: [place];  Service: Ophthalmology;  Laterality: Right;  . MASTECTOMY Bilateral     Prior to Admission medications   Medication Sig Start Date End Date Taking? Authorizing Provider  acetaminophen (TYLENOL) 500 MG tablet Take 500 mg by mouth 2 (two) times daily as needed.   Yes [provider]  GLUCOSAMINE-CHONDROITIN PO Take by mouth daily. [place] Tamaflex   Yes [provider]  hydrochlorothiazide (HYDRODIURIL) 25 MG tablet Take 25 mg by mouth daily.   Yes [provider]  ibuprofen (ADVIL) 200 MG tablet Take 600 mg by mouth every 6 (six) hours as needed for moderate pain.   Yes [provider]  lisinopril (ZESTRIL) 20 MG tablet Take 20 mg by mouth daily.   Yes [provider]  simvastatin (ZOCOR) 40 MG tablet Take 40 mg by mouth daily.   Yes [provider]  vitamin B-12 (CYANOCOBALAMIN) 500 MCG tablet Take 500 mcg by mouth daily.   Yes [provider]  Vitamin D3 (VITAMIN D) 25 MCG tablet Take 1,000 Units by mouth daily.   Yes [provider]  Melatonin 10 MG TABS Take by mouth at bedtime.  Patient not taking: No sig reported    [provider]    Allergies Codeine, Scallops [shellfish allergy], and Red dye  No family history on file.  Social History Social History   Tobacco Use  . Smoking status:  Never Smoker  . Smokeless tobacco: Never Used  Vaping Use  . Vaping Use: Never used  Substance Use Topics  . Alcohol use: Not Currently    Review of Systems  Review of Systems  Constitutional: Negative for fatigue and fever.  HENT: Negative for congestion and sore throat.   Eyes: Negative for visual disturbance.  Respiratory: Negative for cough and shortness of breath.   Cardiovascular: Negative for chest pain.  Gastrointestinal: Negative for abdominal pain, diarrhea, nausea and vomiting.  Genitourinary: Negative for flank pain.  Musculoskeletal: Positive  for arthralgias and gait problem. Negative for back pain and neck pain.  Skin: Negative for rash and wound.  Neurological: Negative for weakness.  All other systems reviewed and are negative.    ____________________________________________  PHYSICAL EXAM:      VITAL SIGNS: ED Triage Vitals  Enc Vitals Group     BP 08/27/20 1317 120/75     Pulse Rate 08/27/20 1317 78     Resp 08/27/20 1317 16     Temp 08/27/20 1317 97.8 F (36.6 C)     Temp Source 08/27/20 1317 Oral     SpO2 08/27/20 1317 97 %     Weight 08/27/20 1319 219 lb (99.3 kg)     Height 08/27/20 1319 5\' 5"  (1.651 m)     Head Circumference --      Peak Flow --      Pain Score 08/27/20 1318 10     Pain Loc --      Pain Edu? --      Excl. in [place]? --      Physical Exam Vitals and nursing note reviewed.  Constitutional:      General: She is not in acute distress.    Appearance: She is well-developed.  HENT:     Head: Normocephalic and atraumatic.  Eyes:     Conjunctiva/sclera: Conjunctivae normal.  Cardiovascular:     Rate and Rhythm: Normal rate and regular rhythm.     Heart sounds: Normal heart sounds. No murmur heard. No friction rub.  Pulmonary:     Effort: Pulmonary effort is normal. No respiratory distress.     Breath sounds: Normal breath sounds. No wheezing or rales.  Abdominal:     General: There is no distension.     Palpations: Abdomen is soft.     Tenderness: There is no abdominal tenderness.  Musculoskeletal:     Cervical back: Neck supple.  Skin:    General: Skin is warm.     Capillary Refill: Capillary refill takes less than 2 seconds.  Neurological:     Mental Status: She is alert and oriented to person, place, and time.     Motor: No abnormal muscle tone.      LOWER EXTREMITY EXAM: Right  INSPECTION & PALPATION: Shortening and external rotation of the right leg.  Exquisite tenderness throughout the femur and right hip.  No open wounds.  No edema.  SENSORY: sensation is intact to  light touch in:  Superficial peroneal nerve distribution (over dorsum of foot) Deep peroneal nerve distribution (over first dorsal web space) Sural nerve distribution (over lateral aspect 5th metatarsal) Saphenous nerve distribution (over medial instep)  MOTOR:  + Motor EHL (great toe dorsiflexion) + FHL (great toe plantar flexion)  + TA (ankle dorsiflexion)  + GSC (ankle plantar flexion)  VASCULAR: 2+ dorsalis pedis and posterior tibialis pulses Capillary refill < 2 sec, toes warm and well-perfused  COMPARTMENTS: Soft, warm, well-perfused No pain with  passive extension No parethesias    ____________________________________________   LABS (all labs ordered are listed, but only abnormal results are displayed)  Labs Reviewed  BASIC METABOLIC PANEL - Abnormal; Notable for the following components:      Result Value   Sodium 134 (*)    Potassium 3.2 (*)    CO2 21 (*)    Glucose, Bld 113 (*)    All other components within normal limits  RESP PANEL BY RT-PCR (FLU A&B, COVID) ARPGX2  CBC WITH DIFFERENTIAL/PLATELET  CBC  BASIC METABOLIC PANEL  TYPE AND SCREEN    ____________________________________________  EKG: Normal sinus rhythm, VR 82. PR 193, QRS 120, QTc 502. No acute ST elevations or depressions. No ischemia or infarct. ________________________________________  RADIOLOGY All imaging, including plain films, CT scans, and ultrasounds, independently reviewed by me, and interpretations confirmed via formal radiology reads.  ED MD interpretation:   CXR: No PNA or PTX, ? R paratracheal fullness XR Hip: R femoral IT fx  Official radiology report(s): DG Chest 1 View  Result Date: 08/27/2020 CLINICAL DATA:  Right hip pain after fall. EXAM: CHEST  1 VIEW COMPARISON:  None. FINDINGS: Normal cardiac size. Right paratracheal prominence is noted which may represent dilated aorta or other vascular structure, but adenopathy cannot be excluded. Both lungs are clear. The  visualized skeletal structures are unremarkable. IMPRESSION: Right paratracheal prominence is noted which may be vascular in etiology, but CT scan of the chest with intravenous contrast is recommended to rule out adenopathy or other pathology. Electronically Signed   By: Marijo Conception M.D.   On: 08/27/2020 14:34   CT Chest W Contrast  Result Date: 08/27/2020 CLINICAL DATA:  Right paratracheal prominence at chest radiography. EXAM: CT CHEST WITH CONTRAST TECHNIQUE: Multidetector CT imaging of the chest was performed during intravenous contrast administration. CONTRAST:  99mL OMNIPAQUE IOHEXOL 300 MG/ML  SOLN COMPARISON:  Chest radiography same day FINDINGS: Cardiovascular: Heart size upper limits of normal. No pericardial fluid. Some coronary artery calcification. Some aortic atherosclerotic calcification. No pulmonary artery pathology visible. Mediastinum/Nodes: No mediastinal or hilar mass or lymphadenopathy. Specifically, no right paratracheal adenopathy or mass. Lungs/Pleura: Minimal pulmonary scarring. No infiltrate or collapse. Small amount of pleural fluid or pleural thickening on the left with minimal dependent atelectasis at the left lung base. 1 cm nodule or focal scar in the lateral left lower lobe. Upper Abdomen: No significant upper abdominal finding. Musculoskeletal: Mild curvature in degenerative change of the thoracic spine. No evidence of thoracic fracture. No sternal fracture. No rib fracture. IMPRESSION: No paratracheal mass or lymphadenopathy. Some coronary artery calcification. Mild aortic atherosclerotic calcification. Small left pleural effusion layering dependently with dependent pulmonary atelectasis. 1 cm nodule or scar in the lateral left lower lobe, axial image 85. Scar is favored. Consider one of the following in 3 months for both low-risk and high-risk individuals: (a) repeat chest CT, (b) follow-up PET-CT, or (c) tissue sampling. This recommendation follows the consensus  statement: Guidelines for Management of Incidental Pulmonary Nodules Detected on CT Images: From the Fleischner Society 2017; Radiology 2017; 284:228-243. Aortic Atherosclerosis (ICD10-I70.0). Electronically Signed   By: Nelson Chimes M.D.   On: 08/27/2020 15:19   CT Hip Right Wo Contrast  Result Date: 08/27/2020 CLINICAL DATA:  Right femur fracture.  History of breast cancer EXAM: CT OF THE RIGHT HIP WITHOUT CONTRAST TECHNIQUE: Multidetector CT imaging of the right hip was performed according to the standard protocol. Multiplanar CT image reconstructions were also generated. COMPARISON:  X-ray  08/27/2020 FINDINGS: Bones/Joint/Cartilage Acute subtrochanteric fracture of the proximal right femur just along the inferior margin of the lesser trochanter. Prominent varus angulation at the fracture site. Nondisplaced fracture extends through the lesser trochanter. Mixed lytic and sclerotic appearance of the underlying bone within this region suspicious for pathologic fracture (series 6, image 66-69). Endosteal scalloping noted along the posterior margin of the subtrochanteric left femur (series 2, image 84). No fracture involvement of the femoral neck or femoral head. Hip joint is intact without dislocation. There is a large lytic lesion within the left iliac bone measuring at least 7.0 cm in maximal dimension (series 6, images 77-96). Additional smaller lytic lesion within the right iliac bone adjacent to the right sacroiliac joint measuring up to 2.3 cm (series 6, image 80). Additional ill-defined area of patchy sclerosis within the anterior aspect of the right iliac crest, likely secondary to an underlying lesion (series 2, image 19). Ligaments Suboptimally assessed by CT. Muscles and Tendons No acute musculotendinous injury by CT. Right gluteus medius and minimus muscle atrophy. Soft tissues No soft tissue edema or fluid collection. No right inguinal lymphadenopathy. IMPRESSION: 1. Acute pathologic subtrochanteric  fracture of the proximal right femur through a mixed lytic and sclerotic bone lesion. 2. Additional predominantly lytic bony lesions throughout the visualized portion of the pelvis including large lytic lesion within the left iliac bone measuring approximately 7.0 cm. Findings are highly suspicious for metastatic disease given history of breast cancer. Electronically Signed   By: Davina Poke D.O.   On: 08/27/2020 15:40   DG Hip Unilat  With Pelvis 2-3 Views Right  Addendum Date: 08/27/2020   ADDENDUM REPORT: 08/27/2020 15:50 ADDENDUM: Areas of sclerosis and lucency involving the left ilium. There are also areas of lucency involving the right trochanteric region. These findings are suggestive for bone lesions and pathologic fracture of the proximal right femur. These findings are better characterized on the right hip CT. Electronically Signed   By: Markus Daft M.D.   On: 08/27/2020 15:50   Result Date: 08/27/2020 CLINICAL DATA:  Right hip pain and fall. EXAM: DG HIP (WITH OR WITHOUT PELVIS) 2-3V RIGHT COMPARISON:  None FINDINGS: Displaced and slightly angulated fracture of the proximal right femur. The fracture appears to be involving the subtrochanteric region although there may be a component in the right greater trochanter. Pelvic bony ring appears to be intact. Sclerosis and degenerative changes in lower lumbar spine. Normal appearance of the left hip on the pelvic view. Right hip joint is located. IMPRESSION: Displaced right subtrochanteric femur fracture with probable trochanteric extension. Electronically Signed: By: Markus Daft M.D. On: 08/27/2020 14:33    ____________________________________________  PROCEDURES   Procedure(s) performed (including Critical Care):  .1-3 Lead EKG Interpretation Performed by: Duffy Bruce, MD Authorized by: Duffy Bruce, MD     Interpretation: normal     ECG rate:  90   ECG rate assessment: normal     Rhythm: sinus rhythm     Ectopy: none      Conduction: normal   Comments:     Indication: Fall, hip fx    ____________________________________________  INITIAL IMPRESSION / MDM / ASSESSMENT AND PLAN / ED COURSE  As part of my medical decision making, I reviewed the following data within the [place] notes reviewed and incorporated, Old chart reviewed, Notes from prior ED visits, and [place] Controlled Substance Database       *Melanie Malone was evaluated in Emergency Department on 08/27/2020 for the  symptoms described in the history of present illness. She was evaluated in the context of the global COVID-19 pandemic, which necessitated consideration that the patient might be at risk for infection with the SARS-CoV-2 virus that causes COVID-19. Institutional protocols and algorithms that pertain to the evaluation of patients at risk for COVID-19 are in a state of rapid change based on information released by regulatory bodies including the CDC and federal and state organizations. These policies and algorithms were followed during the patient's care in the ED.  Some ED evaluations and interventions may be delayed as a result of limited staffing during the pandemic.*     Medical Decision Making:  75 yo F here with R hip pain. Story of atraumatic R hip pain is concerning for possible pathologic fx, esp in setting of h/o breast CA. No known h/o metastatic disease. Distal NV is intact. No other signs of trauma. Plain films reviewed and show R femoral IT fx. Discussed with Dr. Rudene Christians .Will admit for pain control, surgical management. Pt updated and in agreement with plan.  ____________________________________________  FINAL CLINICAL IMPRESSION(S) / ED DIAGNOSES  Final diagnoses:  None     MEDICATIONS GIVEN DURING THIS VISIT:  Medications  acetaminophen (TYLENOL) tablet 500 mg (has no administration in time range)  lisinopril (ZESTRIL) tablet 20 mg (has no administration in time range)  simvastatin (ZOCOR) tablet  40 mg (has no administration in time range)  vitamin B-12 (CYANOCOBALAMIN) tablet 500 mcg (has no administration in time range)  melatonin tablet 10 mg (has no administration in time range)  cholecalciferol (VITAMIN D3) tablet 1,000 Units (has no administration in time range)  HYDROcodone-acetaminophen (NORCO/VICODIN) 5-325 MG per tablet 1-2 tablet (has no administration in time range)  morphine 2 MG/ML injection 0.5 mg (has no administration in time range)  methocarbamol (ROBAXIN) tablet 500 mg (has no administration in time range)    Or  methocarbamol (ROBAXIN) 500 mg in dextrose 5 % 50 mL IVPB (has no administration in time range)  senna (SENOKOT) tablet 8.6 mg (8.6 mg Oral Patient Refused/Not Given 08/27/20 1635)  potassium chloride SA (KLOR-CON) CR tablet 40 mEq (has no administration in time range)  ceFAZolin (ANCEF) IVPB 2g/100 mL premix (has no administration in time range)  ondansetron (ZOFRAN) injection 4 mg (4 mg Intravenous Given 08/27/20 1444)  HYDROmorphone (DILAUDID) injection 0.5 mg (0.5 mg Intravenous Given 08/27/20 1444)  iohexol (OMNIPAQUE) 300 MG/ML solution 75 mL (75 mLs Intravenous Contrast Given 08/27/20 1457)     ED Discharge Orders    None       Note:  This document was prepared using Dragon voice recognition software and may include unintentional dictation errors.   Duffy Bruce, MD 08/27/20 1739

## 2020-08-27 NOTE — ED Notes (Signed)
Pt to CT

## 2020-08-27 NOTE — ED Notes (Signed)
ED secretary calling transport to bring pt to floor.

## 2020-08-27 NOTE — Plan of Care (Signed)
  Problem: Education: Goal: Knowledge of General Education information will improve Description: Including pain rating scale, medication(s)/side effects and non-pharmacologic comfort measures Outcome: Progressing   Problem: Health Behavior/Discharge Planning: Goal: Ability to manage health-related needs will improve Outcome: Progressing   Problem: Health Behavior/Discharge Planning: Goal: Ability to manage health-related needs will improve Outcome: Progressing   Problem: Clinical Measurements: Goal: Ability to maintain clinical measurements within normal limits will improve Outcome: Progressing Goal: Will remain free from infection Outcome: Progressing Goal: Diagnostic test results will improve Outcome: Progressing Goal: Respiratory complications will improve Outcome: Progressing Goal: Cardiovascular complication will be avoided Outcome: Progressing   Problem: Activity: Goal: Risk for activity intolerance will decrease Outcome: Progressing   Problem: Nutrition: Goal: Adequate nutrition will be maintained Outcome: Progressing   Problem: Coping: Goal: Level of anxiety will decrease Outcome: Progressing   Problem: Elimination: Goal: Will not experience complications related to bowel motility Outcome: Progressing Goal: Will not experience complications related to urinary retention Outcome: Progressing   Problem: Elimination: Goal: Will not experience complications related to urinary retention Outcome: Progressing

## 2020-08-28 ENCOUNTER — Encounter: Payer: Self-pay | Admitting: Internal Medicine

## 2020-08-28 ENCOUNTER — Encounter
Admission: EM | Disposition: A | Discharge status: Skilled Nursing Facility | Payer: Self-pay | Source: Home / Self Care | Attending: Internal Medicine

## 2020-08-28 ENCOUNTER — Inpatient Hospital Stay: Payer: Medicare HMO | Admitting: Anesthesiology

## 2020-08-28 ENCOUNTER — Inpatient Hospital Stay: Payer: Medicare HMO

## 2020-08-28 DIAGNOSIS — I10 Essential (primary) hypertension: Secondary | ICD-10-CM

## 2020-08-28 DIAGNOSIS — S7221XD Displaced subtrochanteric fracture of right femur, subsequent encounter for closed fracture with routine healing: Secondary | ICD-10-CM

## 2020-08-28 DIAGNOSIS — M899 Disorder of bone, unspecified: Secondary | ICD-10-CM | POA: Diagnosis not present

## 2020-08-28 HISTORY — PX: INTRAMEDULLARY (IM) NAIL INTERTROCHANTERIC: SHX5875

## 2020-08-28 LAB — SURGICAL PCR SCREEN
MRSA, PCR: NEGATIVE
Staphylococcus aureus: NEGATIVE

## 2020-08-28 LAB — CBC
HCT: 38.4 % (ref 36.0–46.0)
Hemoglobin: 13.1 g/dL (ref 12.0–15.0)
MCH: 30.3 pg (ref 26.0–34.0)
MCHC: 34.1 g/dL (ref 30.0–36.0)
MCV: 88.9 fL (ref 80.0–100.0)
Platelets: 291 10*3/uL (ref 150–400)
RBC: 4.32 MIL/uL (ref 3.87–5.11)
RDW: 13.2 % (ref 11.5–15.5)
WBC: 9.4 10*3/uL (ref 4.0–10.5)
nRBC: 0 % (ref 0.0–0.2)

## 2020-08-28 LAB — BASIC METABOLIC PANEL
Anion gap: 11 (ref 5–15)
BUN: 15 mg/dL (ref 8–23)
CO2: 24 mmol/L (ref 22–32)
Calcium: 9.1 mg/dL (ref 8.9–10.3)
Chloride: 100 mmol/L (ref 98–111)
Creatinine, Ser: 0.67 mg/dL (ref 0.44–1.00)
GFR, Estimated: >60 mL/min (ref >60)
Glucose, Bld: 112 mg/dL — ABNORMAL HIGH (ref 70–99)
Potassium: 3.5 mmol/L (ref 3.5–5.1)
Sodium: 135 mmol/L (ref 135–145)

## 2020-08-28 LAB — LACTATE DEHYDROGENASE: LDH: 153 U/L (ref 98–192)

## 2020-08-28 SURGERY — FIXATION, FRACTURE, INTERTROCHANTERIC, WITH INTRAMEDULLARY ROD
Anesthesia: Spinal | Laterality: Right

## 2020-08-28 MED ORDER — APREPITANT 40 MG PO CAPS
ORAL_CAPSULE | ORAL | Status: AC
  Administered 2020-08-28: 40 mg via ORAL
  Filled 2020-08-28: qty 1

## 2020-08-28 MED ORDER — LIDOCAINE HCL (PF) 2 % IJ SOLN
INTRAMUSCULAR | Status: AC
  Filled 2020-08-28: qty 2

## 2020-08-28 MED ORDER — LACTATED RINGERS IV SOLN: INTRAVENOUS | Status: DC | PRN

## 2020-08-28 MED ORDER — MENTHOL 3 MG MT LOZG
1.0000 | LOZENGE | OROMUCOSAL | Status: DC | PRN
  Filled 2020-08-28: qty 9

## 2020-08-28 MED ORDER — ALUM & MAG HYDROXIDE-SIMETH 200-200-20 MG/5ML PO SUSP: 30.0000 mL | ORAL | Status: DC | PRN

## 2020-08-28 MED ORDER — DEXAMETHASONE SODIUM PHOSPHATE 10 MG/ML IJ SOLN
INTRAMUSCULAR | Status: AC
  Filled 2020-08-28: qty 1

## 2020-08-28 MED ORDER — METOCLOPRAMIDE HCL 10 MG PO TABS: 5.0000 mg | ORAL_TABLET | Freq: Three times a day (TID) | ORAL | Status: DC | PRN

## 2020-08-28 MED ORDER — TRANEXAMIC ACID-NACL 1000-0.7 MG/100ML-% IV SOLN
INTRAVENOUS | Status: AC
  Administered 2020-08-28: 1000 mg via INTRAVENOUS
  Filled 2020-08-28: qty 100

## 2020-08-28 MED ORDER — FENTANYL CITRATE (PF) 100 MCG/2ML IJ SOLN
INTRAMUSCULAR | Status: DC | PRN
  Administered 2020-08-28 (×3): 50 ug via INTRAVENOUS

## 2020-08-28 MED ORDER — SUCCINYLCHOLINE CHLORIDE 20 MG/ML IJ SOLN
INTRAMUSCULAR | Status: DC | PRN
  Administered 2020-08-28: 120 mg via INTRAVENOUS

## 2020-08-28 MED ORDER — TRANEXAMIC ACID-NACL 1000-0.7 MG/100ML-% IV SOLN: 1000.0000 mg | Freq: Once | INTRAVENOUS | Status: AC

## 2020-08-28 MED ORDER — CHLORHEXIDINE GLUCONATE CLOTH 2 % EX PADS
6.0000 | MEDICATED_PAD | Freq: Every day | CUTANEOUS | Status: DC
  Administered 2020-08-28 – 2020-09-02 (×4): 6 via TOPICAL

## 2020-08-28 MED ORDER — DOCUSATE SODIUM 100 MG PO CAPS
100.0000 mg | ORAL_CAPSULE | Freq: Two times a day (BID) | ORAL | Status: DC
  Administered 2020-08-28: 100 mg via ORAL
  Filled 2020-08-28: qty 1

## 2020-08-28 MED ORDER — CEFAZOLIN SODIUM-DEXTROSE 2-4 GM/100ML-% IV SOLN
2.0000 g | Freq: Four times a day (QID) | INTRAVENOUS | Status: AC
  Administered 2020-08-28 – 2020-08-29 (×3): 2 g via INTRAVENOUS
  Filled 2020-08-28 (×3): qty 100

## 2020-08-28 MED ORDER — LIDOCAINE HCL (CARDIAC) PF 100 MG/5ML IV SOSY
PREFILLED_SYRINGE | INTRAVENOUS | Status: DC | PRN
  Administered 2020-08-28: 40 mg via INTRAVENOUS

## 2020-08-28 MED ORDER — ONDANSETRON HCL 4 MG/2ML IJ SOLN
INTRAMUSCULAR | Status: AC
  Filled 2020-08-28: qty 2

## 2020-08-28 MED ORDER — ONDANSETRON HCL 4 MG/2ML IJ SOLN
INTRAMUSCULAR | Status: DC | PRN
  Administered 2020-08-28: 4 mg via INTRAVENOUS

## 2020-08-28 MED ORDER — HYDROMORPHONE HCL 1 MG/ML IJ SOLN
INTRAMUSCULAR | Status: DC | PRN
  Administered 2020-08-28: .5 mg via INTRAVENOUS

## 2020-08-28 MED ORDER — FENTANYL CITRATE (PF) 100 MCG/2ML IJ SOLN
INTRAMUSCULAR | Status: AC
  Filled 2020-08-28: qty 2

## 2020-08-28 MED ORDER — PROPOFOL 10 MG/ML IV BOLUS
INTRAVENOUS | Status: AC
  Filled 2020-08-28: qty 20

## 2020-08-28 MED ORDER — SODIUM CHLORIDE 0.9 % IV SOLN: INTRAVENOUS | Status: DC

## 2020-08-28 MED ORDER — SUGAMMADEX SODIUM 200 MG/2ML IV SOLN
INTRAVENOUS | Status: DC | PRN
  Administered 2020-08-28: 200 mg via INTRAVENOUS

## 2020-08-28 MED ORDER — MAGNESIUM HYDROXIDE 400 MG/5ML PO SUSP: 30.0000 mL | Freq: Every day | ORAL | Status: DC | PRN

## 2020-08-28 MED ORDER — TRAMADOL HCL 50 MG PO TABS
50.0000 mg | ORAL_TABLET | Freq: Four times a day (QID) | ORAL | Status: DC | PRN
  Administered 2020-08-29 – 2020-09-03 (×17): 50 mg via ORAL
  Filled 2020-08-28 (×17): qty 1

## 2020-08-28 MED ORDER — HYDROMORPHONE HCL 1 MG/ML IJ SOLN
INTRAMUSCULAR | Status: AC
  Filled 2020-08-28: qty 1

## 2020-08-28 MED ORDER — ACETAMINOPHEN 10 MG/ML IV SOLN
INTRAVENOUS | Status: AC
  Filled 2020-08-28: qty 100

## 2020-08-28 MED ORDER — ENOXAPARIN SODIUM 40 MG/0.4ML IJ SOSY
40.0000 mg | PREFILLED_SYRINGE | INTRAMUSCULAR | Status: DC
  Administered 2020-08-29 – 2020-09-03 (×6): 40 mg via SUBCUTANEOUS
  Filled 2020-08-28 (×6): qty 0.4

## 2020-08-28 MED ORDER — PROPOFOL 10 MG/ML IV BOLUS
INTRAVENOUS | Status: DC | PRN
  Administered 2020-08-28: 140 mg via INTRAVENOUS

## 2020-08-28 MED ORDER — ONDANSETRON HCL 4 MG/2ML IJ SOLN
4.0000 mg | Freq: Once | INTRAMUSCULAR | Status: AC | PRN
  Administered 2020-08-28: 4 mg via INTRAVENOUS

## 2020-08-28 MED ORDER — PHENYLEPHRINE HCL (PRESSORS) 10 MG/ML IV SOLN
INTRAVENOUS | Status: DC | PRN
  Administered 2020-08-28 (×2): 100 ug via INTRAVENOUS

## 2020-08-28 MED ORDER — PHENOL 1.4 % MT LIQD
1.0000 | OROMUCOSAL | Status: DC | PRN
  Filled 2020-08-28: qty 177

## 2020-08-28 MED ORDER — ONDANSETRON HCL 4 MG PO TABS: 4.0000 mg | ORAL_TABLET | Freq: Four times a day (QID) | ORAL | Status: DC | PRN

## 2020-08-28 MED ORDER — METOCLOPRAMIDE HCL 5 MG/ML IJ SOLN: 5.0000 mg | Freq: Three times a day (TID) | INTRAMUSCULAR | Status: DC | PRN

## 2020-08-28 MED ORDER — MAGNESIUM CITRATE PO SOLN
1.0000 | Freq: Once | ORAL | Status: DC | PRN
  Filled 2020-08-28: qty 296

## 2020-08-28 MED ORDER — DEXAMETHASONE SODIUM PHOSPHATE 10 MG/ML IJ SOLN
INTRAMUSCULAR | Status: DC | PRN
  Administered 2020-08-28: 10 mg via INTRAVENOUS

## 2020-08-28 MED ORDER — ONDANSETRON HCL 4 MG/2ML IJ SOLN: 4.0000 mg | Freq: Four times a day (QID) | INTRAMUSCULAR | Status: DC | PRN

## 2020-08-28 MED ORDER — ROCURONIUM BROMIDE 100 MG/10ML IV SOLN
INTRAVENOUS | Status: DC | PRN
  Administered 2020-08-28: 10 mg via INTRAVENOUS

## 2020-08-28 MED ORDER — FENTANYL CITRATE (PF) 100 MCG/2ML IJ SOLN: 25.0000 ug | INTRAMUSCULAR | Status: DC | PRN

## 2020-08-28 MED ORDER — CHLORHEXIDINE GLUCONATE 0.12 % MT SOLN
OROMUCOSAL | Status: AC
  Filled 2020-08-28: qty 15

## 2020-08-28 MED ORDER — APREPITANT 40 MG PO CAPS: 40.0000 mg | ORAL_CAPSULE | Freq: Once | ORAL | Status: AC

## 2020-08-28 MED ORDER — ACETAMINOPHEN 10 MG/ML IV SOLN
INTRAVENOUS | Status: DC | PRN
  Administered 2020-08-28: 1000 mg via INTRAVENOUS

## 2020-08-28 MED ORDER — BISACODYL 10 MG RE SUPP
10.0000 mg | Freq: Every day | RECTAL | Status: DC | PRN
  Filled 2020-08-28: qty 1

## 2020-08-28 SURGICAL SUPPLY — 38 items
BIT DRILL 4.3MMS DISTAL GRDTED (BIT) IMPLANT
BNDG COHESIVE 4X5 TAN STRL (GAUZE/BANDAGES/DRESSINGS) ×4 IMPLANT
CANISTER SUCT 1200ML W/VALVE (MISCELLANEOUS) ×2 IMPLANT
CHLORAPREP W/TINT 26 (MISCELLANEOUS) ×2 IMPLANT
COVER WAND RF STERILE (DRAPES) ×2 IMPLANT
DRAPE 3/4 80X56 (DRAPES) ×2 IMPLANT
DRAPE U-SHAPE 47X51 STRL (DRAPES) ×2 IMPLANT
DRILL 4.3MMS DISTAL GRADUATED (BIT) ×2
DRSG OPSITE POSTOP 3X4 (GAUZE/BANDAGES/DRESSINGS) ×2 IMPLANT
DRSG OPSITE POSTOP 4X6 (GAUZE/BANDAGES/DRESSINGS) ×1 IMPLANT
GAUZE SPONGE 4X4 16PLY XRAY LF (GAUZE/BANDAGES/DRESSINGS) ×1 IMPLANT
GLOVE SURG SYN 9.0  PF PI (GLOVE) ×1
GLOVE SURG SYN 9.0 PF PI (GLOVE) ×1 IMPLANT
GLOVE SURG UNDER POLY LF SZ9 (GLOVE) ×2 IMPLANT
GOWN SRG 2XL LVL 4 RGLN SLV (GOWNS) ×1 IMPLANT
GOWN STRL NON-REIN 2XL LVL4 (GOWNS) ×1
GOWN STRL REUS W/ TWL LRG LVL3 (GOWN DISPOSABLE) ×1 IMPLANT
GOWN STRL REUS W/TWL LRG LVL3 (GOWN DISPOSABLE) ×1
GUIDEPIN VERSANAIL DSP 3.2X444 (ORTHOPEDIC DISPOSABLE SUPPLIES) ×1 IMPLANT
GUIDEWIRE BALL NOSE 80CM (WIRE) ×2 IMPLANT
HFN LAG SCREW 10.5MM X 115MM (Orthopedic Implant) ×1 IMPLANT
HFN RH 130 DEG 9MM X 340MM (Nail) ×1 IMPLANT
KIT TURNOVER KIT A (KITS) ×2 IMPLANT
MANIFOLD NEPTUNE II (INSTRUMENTS) ×2 IMPLANT
MAT ABSORB  FLUID 56X50 GRAY (MISCELLANEOUS) ×1
MAT ABSORB FLUID 56X50 GRAY (MISCELLANEOUS) ×1 IMPLANT
NDL FILTER BLUNT 18X1 1/2 (NEEDLE) ×1 IMPLANT
NEEDLE FILTER BLUNT 18X 1/2SAF (NEEDLE) ×1
NEEDLE FILTER BLUNT 18X1 1/2 (NEEDLE) ×1 IMPLANT
NS IRRIG 500ML POUR BTL (IV SOLUTION) ×2 IMPLANT
PACK HIP COMPR (MISCELLANEOUS) ×2 IMPLANT
SCALPEL PROTECTED #15 DISP (BLADE) ×4 IMPLANT
SCREW BONE CORTICAL 5.0X42 (Screw) ×1 IMPLANT
STAPLER SKIN PROX 35W (STAPLE) ×2 IMPLANT
SUT VIC AB 1 CT1 36 (SUTURE) ×2 IMPLANT
SUT VIC AB 2-0 CT1 (SUTURE) ×2 IMPLANT
SYR 10ML LL (SYRINGE) ×2 IMPLANT
SYR BULB IRRIG 60ML STRL (SYRINGE) ×2 IMPLANT

## 2020-08-28 NOTE — Care Management Important Message (Signed)
Important Message  Patient Details  Name: Melanie Malone MRN: [id number] Date of Birth: April 28, 1945   Medicare Important Message Given:  N/A - LOS <3 / Initial given by admissions  Initial Medicare IM reviewed with patient by S. Geanie Cooley, Patient Access Associate on 08/28/2020 at 11:08am.    Melanie Malone 08/28/2020, 7:03 PM

## 2020-08-28 NOTE — Op Note (Signed)
08/28/2020  5:00 PM  PATIENT:  Rhae Lerner  75 y.o. female  PRE-OPERATIVE DIAGNOSIS:  Right Hip Fracture right subtrochanteric pathologic  POST-OPERATIVE DIAGNOSIS:  Right Hip Fracture same  PROCEDURE:  Procedure(s): INTRAMEDULLARY (IM) NAIL INTERTROCHANTRIC (Right)  SURGEON: Laurene Footman, MD  ASSISTANTS: None  ANESTHESIA:   general  EBL:  Total I/O In: 200 [IV Piggyback:200] Out: 700 [Urine:400; Blood:300]  BLOOD ADMINISTERED:none  DRAINS: none   LOCAL MEDICATIONS USED:  NONE  SPECIMEN:  Source of Specimen:  Biopsy of femur at lesser trochanter along with femoral neck and canal reamings  DISPOSITION OF SPECIMEN:  PATHOLOGY  COUNTS:  YES  TOURNIQUET:  * No tourniquets in log *  IMPLANTS: Biomet affixes 9 x 340 with 115 mm leg screw and 42 mm distal interlocking screw  DICTATION: .Dragon Dictation patient was brought to the operating room and after adequate anesthesia was obtained the right leg was placed in the traction boot left leg in the well-leg holder with traction applied nearly anatomic reduction was obtained.  After prepping and draping using a barrier drape method appropriate patient identification and timeout procedure were completed.  Incision was made proximally greater trochanter guidewire inserted proximal reaming and then passing of a guidewire.  There was some sclerotic bone at that level consistent with metastatic disease.  The guidewire was inserted and reaming carried out with the reamings sent for specimen.  A second incision was made at the what would be the site from the proximal interlocking screw and a curette and pituitary were used to get specimens from the area lesser trochanter which seem to be the area of the most pathology on the CT scan.  This was a second specimen.  Next the long guidewire was having been placed was reamed with 11 mm and there is chatter within the isthmus so the 340 x 9 rod was inserted based on the measurement from the  guidewire.  When this was inserted to the appropriate depth proximal reaming was carried out draped in the guidewire was set placed in the center center position of the head measured and then drilled 115 specimen was also taken off of this drill as another specimen for possible metastatic disease.  That leg screw was inserted with tightening proximally followed by a quarter turn off and then compression.  Next distal interlocking screw was placed through the oblique screw hole after traction been released.  The wounds were thoroughly irrigated with #1 Vicryl for the deep fascia, 2-0 Vicryl subcutaneously followed by skin staples and a dressing of Xeroform 4 x 4's ABD and foam tape  PLAN OF CARE: Admit to inpatient   PATIENT DISPOSITION:  PACU - hemodynamically stable.

## 2020-08-28 NOTE — Progress Notes (Signed)
Getting ready to transfer back to room 158, small emesis:  4mg  Zofran SIVP for nausea, patient states within 3 minutes "feel better already"

## 2020-08-28 NOTE — Progress Notes (Signed)
Progress Note    Melanie Malone  YSA:[id number] DOB: 04-Oct-1945  DOA: 08/27/2020 PCP: Dion Body, MD      Brief Narrative:    Medical records reviewed and are as summarized below:  Melanie Malone is a 75 y.o. female several years ago, morbid obesity, who presented to the hospital because of severe right hip pain.  She said her right leg giving way while walking up the stairs and she could no longer ambulate.  She did not fall and was able to grab onto the rail and called out for help.  However, she mentioned that she fell about 3 months prior to admission and have been experiencing pain in the right hip for some time although she was able to ambulate at that time.  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    Assessment/Plan:   Principal Problem:   Closed displaced subtrochanteric fracture of right femur (HCC) Active Problems:   Breast CA (HCC)   Obesity (BMI 30-39.9)   Hypertension   Body mass index is 36.43 kg/m.  (Morbid obesity)   Pathologic right femur fracture: Analgesics as needed for pain.  Plan for right hip surgery today.  Follow-up with orthopedic surgeon.  Hypertension: Continue antihypertensives  Hypokalemia: Improved  Remote history of breast cancer s/p bilateral mastectomy and chemoradiation.       Diet Order            Diet NPO time specified  Diet effective midnight                    Consultants:  Orthopedic surgeon  Procedures:  Plan for hip surgery today    Medications:   . [MAR Hold] cholecalciferol  1,000 Units Oral Daily  . [MAR Hold] lisinopril  20 mg Oral Daily  . [MAR Hold] melatonin  10 mg Oral QHS  . [MAR Hold] potassium chloride  40 mEq Oral Once  . [MAR Hold] senna  1 tablet Oral BID  . [MAR Hold] simvastatin  40 mg Oral QHS  . [MAR Hold] vitamin B-12  500 mcg Oral Daily   Continuous Infusions: . [MAR Hold] methocarbamol (ROBAXIN) IV        Anti-infectives (From admission, onward)   Start     Dose/Rate Route Frequency Ordered Stop   08/28/20 1500  [MAR Hold]  ceFAZolin (ANCEF) IVPB 2g/100 mL premix        (MAR Hold since Thu 08/28/2020 at 1431.Hold Reason: Transfer to a Procedural area.)   2 g 200 mL/hr over 30 Minutes Intravenous  Once 08/27/20 1535 08/28/20 1559             Family Communication/Anticipated D/C date and plan/Code Status   DVT prophylaxis: SCDs Start: 08/27/20 1510     Code Status: Full Code  Family Communication: None Disposition Plan:    Status is: Inpatient  Remains inpatient appropriate because:Inpatient level of care appropriate due to severity of illness   Dispo: The patient is from: Home              Anticipated d/c is to: SNF              Patient currently is not medically stable to d/c.   Difficult to place patient No           Subjective:   C/o right hip pain  Objective:    Vitals:   08/28/20 0008 08/28/20 0314 08/28/20 0748 08/28/20 1442  BP:  125/73 126/67 (!) 100/52 125/64  Pulse: 84 84 78 86  Resp:  16 16 18   Temp:  98.7 F (37.1 C) 98.6 F (37 C) 99.8 F (37.7 C)  TempSrc:  Oral Oral Oral  SpO2:  96% 98% 96%  Weight:    99.3 kg  Height:    5\' 5"  (1.651 m)   No data found.   Intake/Output Summary (Last 24 hours) at 08/28/2020 1627 Last data filed at 08/28/2020 1549 Gross per 24 hour  Intake 100 ml  Output 1200 ml  Net -1100 ml   Filed Weights   08/27/20 1319 08/28/20 1442  Weight: 99.3 kg 99.3 kg    Exam:  GEN: NAD SKIN: No rash EYES: EOMI ENT: MMM CV: RRR PULM: CTA B ABD: soft, obese, NT, +BS CNS: AAO x 3, non focal EXT: No edema or tenderness        Data Reviewed:   I have personally reviewed following labs and imaging studies:  Labs: Labs show the following:   Basic Metabolic Panel: Recent Labs  Lab 08/27/20 1324 08/28/20 0415  NA 134* 135  K 3.2* 3.5  CL 99 100  CO2 21* 24  GLUCOSE 113* 112*  BUN 22  15  CREATININE 0.76 0.67  CALCIUM 9.7 9.1   GFR Estimated Creatinine Clearance: 70.9 mL/min (by C-G formula based on SCr of 0.67 mg/dL). Liver Function Tests: No results for input(s): AST, ALT, ALKPHOS, BILITOT, PROT, ALBUMIN in the last 168 hours. No results for input(s): LIPASE, AMYLASE in the last 168 hours. No results for input(s): AMMONIA in the last 168 hours. Coagulation profile No results for input(s): INR, PROTIME in the last 168 hours.  CBC: Recent Labs  Lab 08/27/20 1324 08/28/20 0415  WBC 10.3 9.4  NEUTROABS 7.4  --   HGB 13.6 13.1  HCT 40.0 38.4  MCV 89.3 88.9  PLT 335 291   Cardiac Enzymes: No results for input(s): CKTOTAL, CKMB, CKMBINDEX, TROPONINI in the last 168 hours. BNP (last 3 results) No results for input(s): PROBNP in the last 8760 hours. CBG: No results for input(s): GLUCAP in the last 168 hours. D-Dimer: No results for input(s): DDIMER in the last 72 hours. Hgb A1c: No results for input(s): HGBA1C in the last 72 hours. Lipid Profile: No results for input(s): CHOL, HDL, LDLCALC, TRIG, CHOLHDL, LDLDIRECT in the last 72 hours. Thyroid function studies: No results for input(s): TSH, T4TOTAL, T3FREE, THYROIDAB in the last 72 hours.  Invalid input(s): FREET3 Anemia work up: No results for input(s): VITAMINB12, FOLATE, FERRITIN, TIBC, IRON, RETICCTPCT in the last 72 hours. Sepsis Labs: Recent Labs  Lab 08/27/20 1324 08/28/20 0415  WBC 10.3 9.4    Microbiology Recent Results (from the past 240 hour(s))  Resp Panel by RT-PCR (Flu A&B, Covid) Nasopharyngeal Swab     Status: None   Collection Time: 08/27/20  3:15 PM   Specimen: Nasopharyngeal Swab; Nasopharyngeal(NP) swabs in vial transport medium  Result Value Ref Range Status   SARS Coronavirus 2 by RT PCR NEGATIVE NEGATIVE Final    Comment: (NOTE) SARS-CoV-2 target nucleic acids are NOT DETECTED.  The SARS-CoV-2 RNA is generally detectable in upper respiratory specimens during the acute  phase of infection. The lowest concentration of SARS-CoV-2 viral copies this assay can detect is 138 copies/mL. A negative result does not preclude SARS-Cov-2 infection and should not be used as the sole basis for treatment or other patient management decisions. A negative result may occur with  improper specimen collection/handling,  submission of specimen other than nasopharyngeal swab, presence of viral mutation(s) within the areas targeted by this assay, and inadequate number of viral copies(<138 copies/mL). A negative result must be combined with clinical observations, patient history, and epidemiological information. The expected result is Negative.  Fact Sheet for Patients:  EntrepreneurPulse.com.au  Fact Sheet for Healthcare Providers:  IncredibleEmployment.be  This test is no t yet approved or cleared by the [place] FDA and  has been authorized for detection and/or diagnosis of SARS-CoV-2 by FDA under an Emergency Use Authorization (EUA). This EUA will remain  in effect (meaning this test can be used) for the duration of the COVID-19 declaration under Section 564(b)(1) of the Act, 21 U.S.C.section 360bbb-3(b)(1), unless the authorization is terminated  or revoked sooner.       Influenza A by PCR NEGATIVE NEGATIVE Final   Influenza B by PCR NEGATIVE NEGATIVE Final    Comment: (NOTE) The Xpert Xpress SARS-CoV-2/FLU/RSV plus assay is intended as an aid in the diagnosis of influenza from Nasopharyngeal swab specimens and should not be used as a sole basis for treatment. Nasal washings and aspirates are unacceptable for Xpert Xpress SARS-CoV-2/FLU/RSV testing.  Fact Sheet for Patients: EntrepreneurPulse.com.au  Fact Sheet for Healthcare Providers: IncredibleEmployment.be  This test is not yet approved or cleared by the [place] FDA and has been authorized for detection and/or diagnosis of  SARS-CoV-2 by FDA under an Emergency Use Authorization (EUA). This EUA will remain in effect (meaning this test can be used) for the duration of the COVID-19 declaration under Section 564(b)(1) of the Act, 21 U.S.C. section 360bbb-3(b)(1), unless the authorization is terminated or revoked.  Performed at [facility], [address], [place], [place] [address]   Surgical pcr screen     Status: None   Collection Time: 08/27/20 11:29 PM   Specimen: Nasal Mucosa; Nasal Swab  Result Value Ref Range Status   MRSA, PCR NEGATIVE NEGATIVE Final   Staphylococcus aureus NEGATIVE NEGATIVE Final    Comment: (NOTE) The Xpert SA Assay (FDA approved for NASAL specimens in patients 20 years of age and older), is one component of a comprehensive surveillance program. It is not intended to diagnose infection nor to guide or monitor treatment. Performed at [facility], [address] [place],  [address]     Procedures and diagnostic studies:  DG Chest 1 View  Result Date: 08/27/2020 CLINICAL DATA:  Right hip pain after fall. EXAM: CHEST  1 VIEW COMPARISON:  None. FINDINGS: Normal cardiac size. Right paratracheal prominence is noted which may represent dilated aorta or other vascular structure, but adenopathy cannot be excluded. Both lungs are clear. The visualized skeletal structures are unremarkable. IMPRESSION: Right paratracheal prominence is noted which may be vascular in etiology, but CT scan of the chest with intravenous contrast is recommended to rule out adenopathy or other pathology. Electronically Signed   By: Marijo Conception M.D.   On: 08/27/2020 14:34   CT Chest W Contrast  Result Date: 08/27/2020 CLINICAL DATA:  Right paratracheal prominence at chest radiography. EXAM: CT CHEST WITH CONTRAST TECHNIQUE: Multidetector CT imaging of the chest was performed during intravenous contrast administration. CONTRAST:  51mL OMNIPAQUE IOHEXOL 300 MG/ML  SOLN COMPARISON:   Chest radiography same day FINDINGS: Cardiovascular: Heart size upper limits of normal. No pericardial fluid. Some coronary artery calcification. Some aortic atherosclerotic calcification. No pulmonary artery pathology visible. Mediastinum/Nodes: No mediastinal or hilar mass or lymphadenopathy. Specifically, no right paratracheal adenopathy or mass. Lungs/Pleura: Minimal  pulmonary scarring. No infiltrate or collapse. Small amount of pleural fluid or pleural thickening on the left with minimal dependent atelectasis at the left lung base. 1 cm nodule or focal scar in the lateral left lower lobe. Upper Abdomen: No significant upper abdominal finding. Musculoskeletal: Mild curvature in degenerative change of the thoracic spine. No evidence of thoracic fracture. No sternal fracture. No rib fracture. IMPRESSION: No paratracheal mass or lymphadenopathy. Some coronary artery calcification. Mild aortic atherosclerotic calcification. Small left pleural effusion layering dependently with dependent pulmonary atelectasis. 1 cm nodule or scar in the lateral left lower lobe, axial image 85. Scar is favored. Consider one of the following in 3 months for both low-risk and high-risk individuals: (a) repeat chest CT, (b) follow-up PET-CT, or (c) tissue sampling. This recommendation follows the consensus statement: Guidelines for Management of Incidental Pulmonary Nodules Detected on CT Images: From the Fleischner Society 2017; Radiology 2017; 284:228-243. Aortic Atherosclerosis (ICD10-I70.0). Electronically Signed   By: Nelson Chimes M.D.   On: 08/27/2020 15:19   CT Hip Right Wo Contrast  Result Date: 08/27/2020 CLINICAL DATA:  Right femur fracture.  History of breast cancer EXAM: CT OF THE RIGHT HIP WITHOUT CONTRAST TECHNIQUE: Multidetector CT imaging of the right hip was performed according to the standard protocol. Multiplanar CT image reconstructions were also generated. COMPARISON:  X-ray 08/27/2020 FINDINGS:  Bones/Joint/Cartilage Acute subtrochanteric fracture of the proximal right femur just along the inferior margin of the lesser trochanter. Prominent varus angulation at the fracture site. Nondisplaced fracture extends through the lesser trochanter. Mixed lytic and sclerotic appearance of the underlying bone within this region suspicious for pathologic fracture (series 6, image 66-69). Endosteal scalloping noted along the posterior margin of the subtrochanteric left femur (series 2, image 84). No fracture involvement of the femoral neck or femoral head. Hip joint is intact without dislocation. There is a large lytic lesion within the left iliac bone measuring at least 7.0 cm in maximal dimension (series 6, images 77-96). Additional smaller lytic lesion within the right iliac bone adjacent to the right sacroiliac joint measuring up to 2.3 cm (series 6, image 80). Additional ill-defined area of patchy sclerosis within the anterior aspect of the right iliac crest, likely secondary to an underlying lesion (series 2, image 19). Ligaments Suboptimally assessed by CT. Muscles and Tendons No acute musculotendinous injury by CT. Right gluteus medius and minimus muscle atrophy. Soft tissues No soft tissue edema or fluid collection. No right inguinal lymphadenopathy. IMPRESSION: 1. Acute pathologic subtrochanteric fracture of the proximal right femur through a mixed lytic and sclerotic bone lesion. 2. Additional predominantly lytic bony lesions throughout the visualized portion of the pelvis including large lytic lesion within the left iliac bone measuring approximately 7.0 cm. Findings are highly suspicious for metastatic disease given history of breast cancer. Electronically Signed   By: Davina Poke D.O.   On: 08/27/2020 15:40   DG Hip Unilat  With Pelvis 2-3 Views Right  Addendum Date: 08/27/2020   ADDENDUM REPORT: 08/27/2020 15:50 ADDENDUM: Areas of sclerosis and lucency involving the left ilium. There are also  areas of lucency involving the right trochanteric region. These findings are suggestive for bone lesions and pathologic fracture of the proximal right femur. These findings are better characterized on the right hip CT. Electronically Signed   By: Markus Daft M.D.   On: 08/27/2020 15:50   Result Date: 08/27/2020 CLINICAL DATA:  Right hip pain and fall. EXAM: DG HIP (WITH OR WITHOUT PELVIS) 2-3V RIGHT COMPARISON:  None  FINDINGS: Displaced and slightly angulated fracture of the proximal right femur. The fracture appears to be involving the subtrochanteric region although there may be a component in the right greater trochanter. Pelvic bony ring appears to be intact. Sclerosis and degenerative changes in lower lumbar spine. Normal appearance of the left hip on the pelvic view. Right hip joint is located. IMPRESSION: Displaced right subtrochanteric femur fracture with probable trochanteric extension. Electronically Signed: By: Markus Daft M.D. On: 08/27/2020 14:33               LOS: 1 day   Timberlynn Kizziah  [facility]   Pager on [email or web address]. If 7PM-7AM, please contact night-coverage at [email or web address]     08/28/2020, 4:27 PM

## 2020-08-28 NOTE — Progress Notes (Signed)
Nutrition Brief Note  New consult received as part of the hip fracture protocol  Pt resting in bed at the time of visit, currently NPO and awaiting surgery. Pt reports a good appetite PTA and no changes in weight. Appears well nourished on exam, do not expect complications post-operatively due to inadequate nutrition.   Wt Readings from Last 15 Encounters:  08/27/20 99.3 kg  02/11/20 99.3 kg  01/14/20 98.7 kg    Body mass index is 36.44 kg/m. Patient meets criteria for obesity class II based on current BMI.   Current diet order is NPO for pending surgery. Labs and medications reviewed.   No nutrition interventions warranted at this time. If nutrition issues arise, please consult RD.   Ranell Patrick, RD, LDN Clinical Dietitian Pager on [place]

## 2020-08-28 NOTE — Anesthesia Preprocedure Evaluation (Signed)
Anesthesia Evaluation  Patient identified by MRN, date of birth, ID band Patient awake    Reviewed: Allergy & Precautions, H&P , NPO status , Patient's Chart, lab work & pertinent test results  History of Anesthesia Complications (+) PONV and history of anesthetic complications  Airway Mallampati: II  TM Distance: >3 FB Neck ROM: full    Dental no notable dental hx.    Pulmonary neg pulmonary ROS,    Pulmonary exam normal breath sounds clear to auscultation       Cardiovascular Exercise Tolerance: Good hypertension, Pt. on medications Normal cardiovascular exam Rhythm:regular Rate:Normal     Neuro/Psych negative neurological ROS     GI/Hepatic negative GI ROS, Neg liver ROS,   Endo/Other  negative endocrine ROSObesity (BMI 37)  Renal/GU negative Renal ROS  negative genitourinary   Musculoskeletal  (+) Arthritis , Osteoarthritis,    Abdominal   Peds negative pediatric ROS (+)  Hematology negative hematology ROS (+)   Anesthesia Other Findings Past Medical History: No date: Arthritis     Comment:  knees, lower back No date: Breast CA (HCC) No date: Hyperlipidemia No date: Hypertension No date: PONV (postoperative nausea and vomiting)  Reproductive/Obstetrics                             Anesthesia Physical  Anesthesia Plan  ASA: II  Anesthesia Plan: Spinal   Post-op Pain Management:    Induction: Intravenous  PONV Risk Score and Plan: 3 and Treatment may vary due to age or medical condition, TIVA, Midazolam and Ondansetron  Airway Management Planned: Nasal Cannula and Natural Airway  Additional Equipment: None  Intra-op Plan:   Post-operative Plan:   Informed Consent: I have reviewed the patients History and Physical, chart, labs and discussed the procedure including the risks, benefits and alternatives for the proposed anesthesia with the patient or authorized  representative who has indicated his/her understanding and acceptance.     Dental Advisory Given  Plan Discussed with: CRNA  Anesthesia Plan Comments:         Anesthesia Quick Evaluation

## 2020-08-28 NOTE — Consult Note (Signed)
[facility] CONSULT NOTE  Patient Care Team: Dion Body, MD as PCP - General (Family Medicine)  CHIEF COMPLAINTS/PURPOSE OF CONSULTATION: Lytic lesions of the right hip/suspected pathologic fracture right hip  HISTORY OF PRESENTING ILLNESS:  Melanie Malone 75 y.o.  female with remote history of breast cancer is currently admitted to the hospital for right hip fracture.  Patient noted to have pain in the last 2 to 3 months.  However the pain got intense/difficulty with ambulation admitted to hospital.  CT scan shows suspected pathologic fracture of the right hip; also sclerotic/lytic lesion of the right ilium.  Patient has been evaluated by orthopedics-awaiting ORIF.  Patient reports a history of breast cancer on the left side in 2003-s/p lumpectomy followed by chemotherapy and radiation.  As per patient reports-stage II [approximate 2 cm primary tumor; 21 lymph nodes negative; reports receiving "red chemo".  Unaware if HER2 positive] as per patient she was started on tamoxifen-.  1 year because of intolerance.  Patient states to have lump in the same breast which was again concerning for malignancy but subsequently proven benign.  However patient underwent bilateral prophylactic mastectomy with reconstruction.  Patient's oncologist was Dr. Anastasia Pall in [place], [place].   Patient moved to [facility] to be closer to her husband's family about 5 years ago.  Patient's husband has scleroderma/O2 oxygen/patient caregiver.   Review of Systems  Constitutional: Negative for chills, diaphoresis, fever, malaise/fatigue and weight loss.  HENT: Negative for nosebleeds and sore throat.   Eyes: Negative for double vision.  Respiratory: Negative for cough, hemoptysis, sputum production, shortness of breath and wheezing.   Cardiovascular: Negative for chest pain, palpitations, orthopnea and leg swelling.  Gastrointestinal: Negative for abdominal pain, blood in stool, constipation,  diarrhea, heartburn, melena, nausea and vomiting.  Genitourinary: Negative for dysuria, frequency and urgency.  Musculoskeletal: Positive for joint pain. Negative for back pain.  Skin: Negative.  Negative for itching and rash.  Neurological: Negative for dizziness, tingling, focal weakness, weakness and headaches.  Endo/Heme/Allergies: Does not bruise/bleed easily.  Psychiatric/Behavioral: Negative for depression. The patient is not nervous/anxious and does not have insomnia.      MEDICAL HISTORY:  Past Medical History:  Diagnosis Date  . Arthritis    knees, lower back  . Breast CA ([place])   . Hyperlipidemia   . Hypertension   . PONV (postoperative nausea and vomiting)     SURGICAL HISTORY: Past Surgical History:  Procedure Laterality Date  . ABDOMINAL HYSTERECTOMY    . BREAST LUMPECTOMY    . BREAST RECONSTRUCTION Bilateral   . BUNIONECTOMY Right   . CATARACT EXTRACTION W/PHACO Left 01/14/2020   Procedure: CATARACT EXTRACTION PHACO AND INTRAOCULAR LENS PLACEMENT ([place]) LEFT;  Surgeon: Eulogio Bear, MD;  Location: [facility];  Service: Ophthalmology;  Laterality: Left;  6.92 0:54.0  . CATARACT EXTRACTION W/PHACO Right 02/11/2020   Procedure: CATARACT EXTRACTION PHACO AND INTRAOCULAR LENS PLACEMENT ([facility]) RIGHT 13.20 01:13.6;  Surgeon: Eulogio Bear, MD;  Location: [facility];  Service: Ophthalmology;  Laterality: Right;  . MASTECTOMY Bilateral     SOCIAL HISTORY: Social History   Socioeconomic History  . Marital status: Married    Spouse name: Not on file  . Number of children: Not on file  . Years of education: Not on file  . Highest education level: Not on file  Occupational History  . Not on file  Tobacco Use  . Smoking status: Never Smoker  . Smokeless tobacco: Never Used  Vaping  Use  . Vaping Use: Never used  Substance and Sexual Activity  . Alcohol use: Not Currently  . Drug use: Not on file  . Sexual activity: Not on file  Other  Topics Concern  . Not on file  Social History Narrative   Patient moved from [place] about 5 years ago to live closer to her husband's family.  Husband with scleroderma/caregiver.  Used to work in [employer] (medical).  No smoking.  No alcohol abuse.  Patient's daughter lives in [place] and [place]   Social Determinants of Health   Financial Resource Strain: Not on file  Food Insecurity: Not on file  Transportation Needs: Not on file  Physical Activity: Not on file  Stress: Not on file  Social Connections: Not on file  Intimate Partner Violence: Not on file    FAMILY HISTORY: Family History  Problem Relation Age of Onset  . Hypertension Mother     ALLERGIES:  is allergic to codeine, scallops [shellfish allergy], vicodin hp [hydrocodone-acetaminophen], and red dye.  MEDICATIONS:  Current Facility-Administered Medications  Medication Dose Route Frequency Provider Last Rate Last Admin  . 0.9 %  sodium chloride infusion   Intravenous Continuous Hessie Knows, MD      . acetaminophen (TYLENOL) tablet 500 mg  500 mg Oral Q6H PRN Hessie Knows, MD      . alum & mag hydroxide-simeth (MAALOX/MYLANTA) 200-200-20 MG/5ML suspension 30 mL  30 mL Oral Q4H PRN Hessie Knows, MD      . bisacodyl (DULCOLAX) suppository 10 mg  10 mg Rectal Daily PRN Hessie Knows, MD      . ceFAZolin (ANCEF) IVPB 2g/100 mL premix  2 g Intravenous Q6H Hessie Knows, MD      . Chlorhexidine Gluconate Cloth 2 % PADS 6 each  6 each Topical Daily Hessie Knows, MD      . cholecalciferol (VITAMIN D3) tablet 1,000 Units  1,000 Units Oral Daily Hessie Knows, MD      . docusate sodium (COLACE) capsule 100 mg  100 mg Oral BID Hessie Knows, MD      . Derrill Memo ON 08/29/2020] enoxaparin (LOVENOX) injection 40 mg  40 mg Subcutaneous Q24H Hessie Knows, MD      . lisinopril (ZESTRIL) tablet 20 mg  20 mg Oral Daily Hessie Knows, MD      . magnesium citrate solution 1 Bottle  1 Bottle Oral Once PRN Hessie Knows, MD      . magnesium hydroxide  (MILK OF MAGNESIA) suspension 30 mL  30 mL Oral Daily PRN Hessie Knows, MD      . melatonin tablet 10 mg  10 mg Oral QHS Hessie Knows, MD      . menthol-cetylpyridinium (CEPACOL) lozenge 3 mg  1 lozenge Oral PRN Hessie Knows, MD       Or  . phenol (CHLORASEPTIC) mouth spray 1 spray  1 spray Mouth/Throat PRN Hessie Knows, MD      . methocarbamol (ROBAXIN) tablet 500 mg  500 mg Oral Q6H PRN Hessie Knows, MD   500 mg at 08/27/20 2035   Or  . methocarbamol (ROBAXIN) 500 mg in dextrose 5 % 50 mL IVPB  500 mg Intravenous Q6H PRN Hessie Knows, MD      . metoCLOPramide (REGLAN) tablet 5-10 mg  5-10 mg Oral Q8H PRN Hessie Knows, MD       Or  . metoCLOPramide (REGLAN) injection 5-10 mg  5-10 mg Intravenous Q8H PRN Hessie Knows, MD      . morphine 2  MG/ML injection 0.5 mg  0.5 mg Intravenous Q2H PRN Hessie Knows, MD   0.5 mg at 08/28/20 1348  . ondansetron (ZOFRAN) 4 MG/2ML injection           . [START ON 08/29/2020] ondansetron (ZOFRAN) tablet 4 mg  4 mg Oral Q6H PRN Hessie Knows, MD       Or  . Derrill Memo ON 08/29/2020] ondansetron (ZOFRAN) injection 4 mg  4 mg Intravenous Q6H PRN Hessie Knows, MD      . ondansetron (ZOFRAN-ODT) disintegrating tablet 4 mg  4 mg Oral Q8H PRN Hessie Knows, MD   4 mg at 08/28/20 1359  . potassium chloride SA (KLOR-CON) CR tablet 40 mEq  40 mEq Oral Once Hessie Knows, MD      . senna [facility]) tablet 8.6 mg  1 tablet Oral BID Hessie Knows, MD      . simvastatin (ZOCOR) tablet 40 mg  40 mg Oral QHS Hessie Knows, MD   40 mg at 08/27/20 2036  . traMADol (ULTRAM) tablet 50 mg  50 mg Oral Q6H PRN Hessie Knows, MD      . vitamin B-12 (CYANOCOBALAMIN) tablet 500 mcg  500 mcg Oral Daily Hessie Knows, MD          .  PHYSICAL EXAMINATION:  Vitals:   08/28/20 1814 08/28/20 2010  BP: 112/82 136/79  Pulse: 80 81  Resp: 17 18  Temp: 98 F (36.7 C) 98.2 F (36.8 C)  SpO2: 93% 94%   Filed Weights   08/27/20 1319 08/28/20 1442  Weight: 219 lb (99.3 kg) 218 lb  14.7 oz (99.3 kg)    Physical Exam HENT:     Head: Normocephalic and atraumatic.     Mouth/Throat:     Pharynx: No oropharyngeal exudate.  Eyes:     Pupils: Pupils are equal, round, and reactive to light.  Cardiovascular:     Rate and Rhythm: Normal rate and regular rhythm.  Pulmonary:     Effort: No respiratory distress.     Breath sounds: No wheezing.  Abdominal:     General: Bowel sounds are normal. There is no distension.     Palpations: Abdomen is soft. There is no mass.     Tenderness: There is no abdominal tenderness. There is no guarding or rebound.  Musculoskeletal:        General: No tenderness. Normal range of motion.     Cervical back: Normal range of motion and neck supple.     Comments: Tenderness and decreased mobility of the right lower extremity  Skin:    General: Skin is warm.  Neurological:     Mental Status: She is alert and oriented to person, place, and time.  Psychiatric:        Mood and Affect: Affect normal.      LABORATORY DATA:  I have reviewed the data as listed Lab Results  Component Value Date   WBC 9.4 08/28/2020   HGB 13.1 08/28/2020   HCT 38.4 08/28/2020   MCV 88.9 08/28/2020   PLT 291 08/28/2020   Recent Labs    08/27/20 1324 08/28/20 0415  NA 134* 135  K 3.2* 3.5  CL 99 100  CO2 21* 24  GLUCOSE 113* 112*  BUN 22 15  CREATININE 0.76 0.67  CALCIUM 9.7 9.1  GFRNONAA >60 >60    RADIOGRAPHIC STUDIES: I have personally reviewed the radiological images as listed and agreed with the findings in the report. DG Chest 1 View  Result  Date: 08/27/2020 CLINICAL DATA:  Right hip pain after fall. EXAM: CHEST  1 VIEW COMPARISON:  None. FINDINGS: Normal cardiac size. Right paratracheal prominence is noted which may represent dilated aorta or other vascular structure, but adenopathy cannot be excluded. Both lungs are clear. The visualized skeletal structures are unremarkable. IMPRESSION: Right paratracheal prominence is noted which may be  vascular in etiology, but CT scan of the chest with intravenous contrast is recommended to rule out adenopathy or other pathology. Electronically Signed   By: Marijo Conception M.D.   On: 08/27/2020 14:34   CT Chest W Contrast  Result Date: 08/27/2020 CLINICAL DATA:  Right paratracheal prominence at chest radiography. EXAM: CT CHEST WITH CONTRAST TECHNIQUE: Multidetector CT imaging of the chest was performed during intravenous contrast administration. CONTRAST:  52m OMNIPAQUE IOHEXOL 300 MG/ML  SOLN COMPARISON:  Chest radiography same day FINDINGS: Cardiovascular: Heart size upper limits of normal. No pericardial fluid. Some coronary artery calcification. Some aortic atherosclerotic calcification. No pulmonary artery pathology visible. Mediastinum/Nodes: No mediastinal or hilar mass or lymphadenopathy. Specifically, no right paratracheal adenopathy or mass. Lungs/Pleura: Minimal pulmonary scarring. No infiltrate or collapse. Small amount of pleural fluid or pleural thickening on the left with minimal dependent atelectasis at the left lung base. 1 cm nodule or focal scar in the lateral left lower lobe. Upper Abdomen: No significant upper abdominal finding. Musculoskeletal: Mild curvature in degenerative change of the thoracic spine. No evidence of thoracic fracture. No sternal fracture. No rib fracture. IMPRESSION: No paratracheal mass or lymphadenopathy. Some coronary artery calcification. Mild aortic atherosclerotic calcification. Small left pleural effusion layering dependently with dependent pulmonary atelectasis. 1 cm nodule or scar in the lateral left lower lobe, axial image 85. Scar is favored. Consider one of the following in 3 months for both low-risk and high-risk individuals: (a) repeat chest CT, (b) follow-up PET-CT, or (c) tissue sampling. This recommendation follows the consensus statement: Guidelines for Management of Incidental Pulmonary Nodules Detected on CT Images: From the Fleischner Society  2017; Radiology 2017; 284:228-243. Aortic Atherosclerosis (ICD10-I70.0). Electronically Signed   By: MNelson ChimesM.D.   On: 08/27/2020 15:19   CT Hip Right Wo Contrast  Result Date: 08/27/2020 CLINICAL DATA:  Right femur fracture.  History of breast cancer EXAM: CT OF THE RIGHT HIP WITHOUT CONTRAST TECHNIQUE: Multidetector CT imaging of the right hip was performed according to the standard protocol. Multiplanar CT image reconstructions were also generated. COMPARISON:  X-ray 08/27/2020 FINDINGS: Bones/Joint/Cartilage Acute subtrochanteric fracture of the proximal right femur just along the inferior margin of the lesser trochanter. Prominent varus angulation at the fracture site. Nondisplaced fracture extends through the lesser trochanter. Mixed lytic and sclerotic appearance of the underlying bone within this region suspicious for pathologic fracture (series 6, image 66-69). Endosteal scalloping noted along the posterior margin of the subtrochanteric left femur (series 2, image 84). No fracture involvement of the femoral neck or femoral head. Hip joint is intact without dislocation. There is a large lytic lesion within the left iliac bone measuring at least 7.0 cm in maximal dimension (series 6, images 77-96). Additional smaller lytic lesion within the right iliac bone adjacent to the right sacroiliac joint measuring up to 2.3 cm (series 6, image 80). Additional ill-defined area of patchy sclerosis within the anterior aspect of the right iliac crest, likely secondary to an underlying lesion (series 2, image 19). Ligaments Suboptimally assessed by CT. Muscles and Tendons No acute musculotendinous injury by CT. Right gluteus medius and minimus muscle atrophy. Soft  tissues No soft tissue edema or fluid collection. No right inguinal lymphadenopathy. IMPRESSION: 1. Acute pathologic subtrochanteric fracture of the proximal right femur through a mixed lytic and sclerotic bone lesion. 2. Additional predominantly lytic  bony lesions throughout the visualized portion of the pelvis including large lytic lesion within the left iliac bone measuring approximately 7.0 cm. Findings are highly suspicious for metastatic disease given history of breast cancer. Electronically Signed   By: Davina Poke D.O.   On: 08/27/2020 15:40   DG HIP OPERATIVE UNILAT W OR W/O PELVIS RIGHT  Result Date: 08/28/2020 CLINICAL DATA:  Right femur IM nail. EXAM: OPERATIVE RIGHT HIP (WITH PELVIS IF PERFORMED) TECHNIQUE: Fluoroscopic spot image(s) were submitted for interpretation post-operatively. COMPARISON:  Preoperative radiograph yesterday. FINDINGS: Five fluoroscopic spot views of the right hip obtained in the operating room. Intramedullary nail with trans trochanteric and distal locking screw traverse proximal femur fracture in improved alignment. Total fluoroscopy time 3 minutes 24 seconds. IMPRESSION: Procedural fluoroscopy during ORIF proximal femur fracture. Electronically Signed   By: Keith Rake M.D.   On: 08/28/2020 17:06   DG Hip Unilat  With Pelvis 2-3 Views Right  Addendum Date: 08/27/2020   ADDENDUM REPORT: 08/27/2020 15:50 ADDENDUM: Areas of sclerosis and lucency involving the left ilium. There are also areas of lucency involving the right trochanteric region. These findings are suggestive for bone lesions and pathologic fracture of the proximal right femur. These findings are better characterized on the right hip CT. Electronically Signed   By: Markus Daft M.D.   On: 08/27/2020 15:50   Result Date: 08/27/2020 CLINICAL DATA:  Right hip pain and fall. EXAM: DG HIP (WITH OR WITHOUT PELVIS) 2-3V RIGHT COMPARISON:  None FINDINGS: Displaced and slightly angulated fracture of the proximal right femur. The fracture appears to be involving the subtrochanteric region although there may be a component in the right greater trochanter. Pelvic bony ring appears to be intact. Sclerosis and degenerative changes in lower lumbar spine. Normal  appearance of the left hip on the pelvic view. Right hip joint is located. IMPRESSION: Displaced right subtrochanteric femur fracture with probable trochanteric extension. Electronically Signed: By: Markus Daft M.D. On: 08/27/2020 14:33    Bone lesion #75 year old female patient with remote history of breast cancer currently admitted hospital for right femoral fracture/suspected pathologic  #Multiple bone lesions noted on CT of the right hip/pathology fracture right femur neck-highly concerning for malignancy/especially given history of breast cancer.   Recommendation:  #Await ORIF; bone biopsy. Discussed with Dr. Menz/Dr.Jones pathology-avoiding decalcification of the specimen/to aid in breast profile if this is recurrent breast cancer.  #Patient also need a CT scan of the abdomen pelvis for further evaluation while inpatient.  Patient will need outpatient PET scan/brain MRI for further evaluation.  Recommend checking tumor markers.  Patient will also benefit from bisphosphonate.  Recommended postoperative radiation to the right hip.  Thank you Dr.Ayiku for allowing me to participate in the care of your pleasant patient. Please do not hesitate to contact me with questions or concerns in the interim.  I offered to speak to patient's family; patient declines at this time.   # I reviewed the blood work- with the patient in detail; also reviewed the imaging independently [as summarized above]; and with the patient in detail.    All questions were answered. The patient knows to call the clinic with any problems, questions or concerns.    Cammie Sickle, MD 08/28/2020 8:39 PM

## 2020-08-28 NOTE — Anesthesia Procedure Notes (Signed)
Procedure Name: Intubation Date/Time: 08/28/2020 3:37 PM Performed by: Aline Brochure, CRNA Pre-anesthesia Checklist: Patient identified, Patient being monitored, Timeout performed, Emergency Drugs available and Suction available Patient Re-evaluated:Patient Re-evaluated prior to induction Oxygen Delivery Method: Circle system utilized Preoxygenation: Pre-oxygenation with 100% oxygen Induction Type: IV induction Ventilation: Mask ventilation without difficulty Laryngoscope Size: 3 and McGraph Grade View: Grade I Tube type: Oral Tube size: 7.0 mm Number of attempts: 1 Airway Equipment and Method: Stylet Placement Confirmation: ETT inserted through vocal cords under direct vision,  positive ETCO2 and breath sounds checked- equal and bilateral Secured at: 22 cm Tube secured with: Tape Dental Injury: Teeth and Oropharynx as per pre-operative assessment

## 2020-08-28 NOTE — Assessment & Plan Note (Addendum)
#   75 year old female patient with remote history of breast cancer currently admitted hospital for right femoral fracture/suspected pathologic  # Multiple bone lesions noted on CT of the right hip/pathology fracture right femur neck-highly concerning for malignancy/especially given history of breast cancer.  S/p ORIF; bone biopsy.  Breast tumor markers elevated; suggestive of recurrent breast cancer.  However pathology still pending.  CT scan abdomen pelvis no evidence of any visceral disease.  We will plan outpatient radiation after healing of the surgical incision.  Patient will also need to be on Zometa; also start AI plus CDK inhibitor.

## 2020-08-28 NOTE — Transfer of Care (Signed)
Immediate Anesthesia Transfer of Care Note  Patient: Melanie Malone  Procedure(s) Performed: INTRAMEDULLARY (IM) NAIL INTERTROCHANTRIC (Right )  Patient Location: PACU  Anesthesia Type:General  Level of Consciousness: awake, alert  and oriented  Airway & Oxygen Therapy: Patient connected to face mask oxygen  Post-op Assessment: Report given to RN and Post -op Vital signs reviewed and stable  Post vital signs: stable  Last Vitals:  Vitals Value Taken Time  BP 116/76 08/28/20 1700  Temp    Pulse 80 08/28/20 1702  Resp 15 08/28/20 1702  SpO2 98 % 08/28/20 1702  Vitals shown include unvalidated device data.  Last Pain:  Vitals:   08/28/20 1442  TempSrc: Oral  PainSc: 7          Complications: No complications documented.

## 2020-08-29 ENCOUNTER — Inpatient Hospital Stay: Payer: Medicare HMO

## 2020-08-29 ENCOUNTER — Encounter: Payer: Self-pay | Admitting: Orthopedic Surgery

## 2020-08-29 ENCOUNTER — Other Ambulatory Visit: Payer: Self-pay

## 2020-08-29 DIAGNOSIS — S7221XD Displaced subtrochanteric fracture of right femur, subsequent encounter for closed fracture with routine healing: Secondary | ICD-10-CM | POA: Diagnosis not present

## 2020-08-29 DIAGNOSIS — M899 Disorder of bone, unspecified: Secondary | ICD-10-CM | POA: Diagnosis not present

## 2020-08-29 LAB — BASIC METABOLIC PANEL
Anion gap: 9 (ref 5–15)
BUN: 13 mg/dL (ref 8–23)
CO2: 23 mmol/L (ref 22–32)
Calcium: 8.6 mg/dL — ABNORMAL LOW (ref 8.9–10.3)
Chloride: 102 mmol/L (ref 98–111)
Creatinine, Ser: 0.58 mg/dL (ref 0.44–1.00)
GFR, Estimated: >60 mL/min (ref >60)
Glucose, Bld: 137 mg/dL — ABNORMAL HIGH (ref 70–99)
Potassium: 3.8 mmol/L (ref 3.5–5.1)
Sodium: 134 mmol/L — ABNORMAL LOW (ref 135–145)

## 2020-08-29 LAB — CBC
HCT: 37.2 % (ref 36.0–46.0)
Hemoglobin: 12.6 g/dL (ref 12.0–15.0)
MCH: 30.4 pg (ref 26.0–34.0)
MCHC: 33.9 g/dL (ref 30.0–36.0)
MCV: 89.6 fL (ref 80.0–100.0)
Platelets: 290 10*3/uL (ref 150–400)
RBC: 4.15 MIL/uL (ref 3.87–5.11)
RDW: 13.3 % (ref 11.5–15.5)
WBC: 10.8 10*3/uL — ABNORMAL HIGH (ref 4.0–10.5)
nRBC: 0 % (ref 0.0–0.2)

## 2020-08-29 MED ORDER — BENZOCAINE 20 % MT AERO
INHALATION_SPRAY | Freq: Four times a day (QID) | OROMUCOSAL | Status: DC | PRN
  Filled 2020-08-29: qty 57

## 2020-08-29 MED ORDER — ENOXAPARIN SODIUM 40 MG/0.4ML IJ SOSY: 40.0000 mg | PREFILLED_SYRINGE | INTRAMUSCULAR | 0 refills | Status: DC

## 2020-08-29 MED ORDER — IOHEXOL 9 MG/ML PO SOLN
500.0000 mL | ORAL | Status: AC
  Administered 2020-08-29 (×2): 500 mL via ORAL

## 2020-08-29 MED ORDER — POLYETHYLENE GLYCOL 3350 17 G PO PACK
17.0000 g | PACK | Freq: Every day | ORAL | Status: DC
  Administered 2020-08-29 – 2020-09-01 (×4): 17 g via ORAL
  Filled 2020-08-29 (×5): qty 1

## 2020-08-29 MED ORDER — TRAMADOL HCL 50 MG PO TABS: 50.0000 mg | ORAL_TABLET | Freq: Four times a day (QID) | ORAL | 0 refills | Status: AC | PRN

## 2020-08-29 NOTE — Progress Notes (Addendum)
Progress Note    Melanie Malone  XBW:[id number] DOB: 08/08/1945  DOA: 08/27/2020 PCP: Dion Body, MD      Brief Narrative:    Medical records reviewed and are as summarized below:  Melanie Malone is a 75 y.o. female several years ago, morbid obesity, who presented to the hospital because of severe right hip pain.  She said her right leg giving way while walking up the stairs and she could no longer ambulate.  She did not fall and was able to grab onto the rail and called out for help.  However, she mentioned that she fell about 3 months prior to admission and have been experiencing pain in the right hip for some time although she was able to ambulate at that time.  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    Assessment/Plan:   Principal Problem:   Closed displaced subtrochanteric fracture of right femur (HCC) Active Problems:   Breast CA (HCC)   Obesity (BMI 30-39.9)   Hypertension   Bone lesion   Body mass index is 36.43 kg/m.  (Morbid obesity)   Pathologic right femur fracture: S/p intramedullary nail intertrochanteric right hip.  Analgesics as needed for pain.  Follow-up with orthopedic surgeon.  Pathology report is pending.  Hypertension: Continue antihypertensives  Hypokalemia: Improved  Mild hyponatremia: Asymptomatic.  Remote history of left breast cancer s/p bilateral mastectomy and chemoradiation in 2003: Appreciate input from oncologist.  Obtain CT abdomen and pelvis for further evaluation.  Plan discussed with patient.       Diet Order            Diet regular Room service appropriate? Yes; Fluid consistency: Thin  Diet effective now                    Consultants:  Orthopedic surgeon  Procedures:  Plan for hip surgery today    Medications:   . Chlorhexidine Gluconate Cloth  6 each Topical Daily  . cholecalciferol  1,000 Units Oral Daily  . enoxaparin (LOVENOX)  injection  40 mg Subcutaneous Q24H  . lisinopril  20 mg Oral Daily  . melatonin  10 mg Oral QHS  . polyethylene glycol  17 g Oral Daily  . potassium chloride  40 mEq Oral Once  . senna  1 tablet Oral BID  . simvastatin  40 mg Oral QHS  . vitamin B-12  500 mcg Oral Daily   Continuous Infusions: . sodium chloride    . methocarbamol (ROBAXIN) IV       Anti-infectives (From admission, onward)   Start     Dose/Rate Route Frequency Ordered Stop   08/28/20 2200  ceFAZolin (ANCEF) IVPB 2g/100 mL premix        2 g 200 mL/hr over 30 Minutes Intravenous Every 6 hours 08/28/20 1822 08/29/20 0937   08/28/20 1500  ceFAZolin (ANCEF) IVPB 2g/100 mL premix        2 g 200 mL/hr over 30 Minutes Intravenous  Once 08/27/20 1535 08/28/20 1559             Family Communication/Anticipated D/C date and plan/Code Status   DVT prophylaxis: enoxaparin (LOVENOX) injection 40 mg Start: 08/29/20 0800 Place TED hose Start: 08/28/20 1823 Foot Pump / plexipulse Start: 08/28/20 1823 SCDs Start: 08/27/20 1510     Code Status: Full Code  Family Communication: None Disposition Plan:    Status is: Inpatient  Remains inpatient appropriate because:Inpatient level of care appropriate  due to severity of illness   Dispo: The patient is from: Home              Anticipated d/c is to: SNF              Patient currently is not medically stable to d/c.   Difficult to place patient No           Subjective:   C/o right hip pain.  PT was at the bedside.  Objective:    Vitals:   08/29/20 0031 08/29/20 0352 08/29/20 0416 08/29/20 0831  BP: 131/88 127/77  120/77  Pulse: 79 81  74  Resp: 20 20  18   Temp:   99.3 F (37.4 C) 98.4 F (36.9 C)  TempSrc:   Oral Oral  SpO2: 95% 94%  96%  Weight:      Height:       No data found.   Intake/Output Summary (Last 24 hours) at 08/29/2020 1557 Last data filed at 08/29/2020 1023 Gross per 24 hour  Intake 540 ml  Output 1000 ml  Net -460 ml    Filed Weights   08/27/20 1319 08/28/20 1442  Weight: 99.3 kg 99.3 kg    Exam:  GEN: NAD SKIN: Warm and dry EYES: No pallor or icterus ENT: MMM CV: RRR PULM: CTA B ABD: soft, obese, NT, +BS CNS: AAO x 3, non focal EXT: Right hip tenderness.  Dressing on right hip surgical wound looks clean, dry and intact      Data Reviewed:   I have personally reviewed following labs and imaging studies:  Labs: Labs show the following:   Basic Metabolic Panel: Recent Labs  Lab 08/27/20 1324 08/28/20 0415 08/29/20 0416  NA 134* 135 134*  K 3.2* 3.5 3.8  CL 99 100 102  CO2 21* 24 23  GLUCOSE 113* 112* 137*  BUN 22 15 13   CREATININE 0.76 0.67 0.58  CALCIUM 9.7 9.1 8.6*   GFR Estimated Creatinine Clearance: 70.9 mL/min (by C-G formula based on SCr of 0.58 mg/dL). Liver Function Tests: No results for input(s): AST, ALT, ALKPHOS, BILITOT, PROT, ALBUMIN in the last 168 hours. No results for input(s): LIPASE, AMYLASE in the last 168 hours. No results for input(s): AMMONIA in the last 168 hours. Coagulation profile No results for input(s): INR, PROTIME in the last 168 hours.  CBC: Recent Labs  Lab 08/27/20 1324 08/28/20 0415 08/29/20 0416  WBC 10.3 9.4 10.8*  NEUTROABS 7.4  --   --   HGB 13.6 13.1 12.6  HCT 40.0 38.4 37.2  MCV 89.3 88.9 89.6  PLT 335 291 290   Cardiac Enzymes: No results for input(s): CKTOTAL, CKMB, CKMBINDEX, TROPONINI in the last 168 hours. BNP (last 3 results) No results for input(s): PROBNP in the last 8760 hours. CBG: No results for input(s): GLUCAP in the last 168 hours. D-Dimer: No results for input(s): DDIMER in the last 72 hours. Hgb A1c: No results for input(s): HGBA1C in the last 72 hours. Lipid Profile: No results for input(s): CHOL, HDL, LDLCALC, TRIG, CHOLHDL, LDLDIRECT in the last 72 hours. Thyroid function studies: No results for input(s): TSH, T4TOTAL, T3FREE, THYROIDAB in the last 72 hours.  Invalid input(s): FREET3 Anemia  work up: No results for input(s): VITAMINB12, FOLATE, FERRITIN, TIBC, IRON, RETICCTPCT in the last 72 hours. Sepsis Labs: Recent Labs  Lab 08/27/20 1324 08/28/20 0415 08/29/20 0416  WBC 10.3 9.4 10.8*    Microbiology Recent Results (from the past 240 hour(s))  Resp Panel by RT-PCR (Flu A&B, Covid) Nasopharyngeal Swab     Status: None   Collection Time: 08/27/20  3:15 PM   Specimen: Nasopharyngeal Swab; Nasopharyngeal(NP) swabs in vial transport medium  Result Value Ref Range Status   SARS Coronavirus 2 by RT PCR NEGATIVE NEGATIVE Final    Comment: (NOTE) SARS-CoV-2 target nucleic acids are NOT DETECTED.  The SARS-CoV-2 RNA is generally detectable in upper respiratory specimens during the acute phase of infection. The lowest concentration of SARS-CoV-2 viral copies this assay can detect is 138 copies/mL. A negative result does not preclude SARS-Cov-2 infection and should not be used as the sole basis for treatment or other patient management decisions. A negative result may occur with  improper specimen collection/handling, submission of specimen other than nasopharyngeal swab, presence of viral mutation(s) within the areas targeted by this assay, and inadequate number of viral copies(<138 copies/mL). A negative result must be combined with clinical observations, patient history, and epidemiological information. The expected result is Negative.  Fact Sheet for Patients:  EntrepreneurPulse.com.au  Fact Sheet for Healthcare Providers:  IncredibleEmployment.be  This test is no t yet approved or cleared by the [place] FDA and  has been authorized for detection and/or diagnosis of SARS-CoV-2 by FDA under an Emergency Use Authorization (EUA). This EUA will remain  in effect (meaning this test can be used) for the duration of the COVID-19 declaration under Section 564(b)(1) of the Act, 21 U.S.C.section 360bbb-3(b)(1), unless the  authorization is terminated  or revoked sooner.       Influenza A by PCR NEGATIVE NEGATIVE Final   Influenza B by PCR NEGATIVE NEGATIVE Final    Comment: (NOTE) The Xpert Xpress SARS-CoV-2/FLU/RSV plus assay is intended as an aid in the diagnosis of influenza from Nasopharyngeal swab specimens and should not be used as a sole basis for treatment. Nasal washings and aspirates are unacceptable for Xpert Xpress SARS-CoV-2/FLU/RSV testing.  Fact Sheet for Patients: EntrepreneurPulse.com.au  Fact Sheet for Healthcare Providers: IncredibleEmployment.be  This test is not yet approved or cleared by the [place] FDA and has been authorized for detection and/or diagnosis of SARS-CoV-2 by FDA under an Emergency Use Authorization (EUA). This EUA will remain in effect (meaning this test can be used) for the duration of the COVID-19 declaration under Section 564(b)(1) of the Act, 21 U.S.C. section 360bbb-3(b)(1), unless the authorization is terminated or revoked.  Performed at [facility], [address], [place], [place] [address]   Surgical pcr screen     Status: None   Collection Time: 08/27/20 11:29 PM   Specimen: Nasal Mucosa; Nasal Swab  Result Value Ref Range Status   MRSA, PCR NEGATIVE NEGATIVE Final   Staphylococcus aureus NEGATIVE NEGATIVE Final    Comment: (NOTE) The Xpert SA Assay (FDA approved for NASAL specimens in patients 33 years of age and older), is one component of a comprehensive surveillance program. It is not intended to diagnose infection nor to guide or monitor treatment. Performed at [facility], [address] [place], [place] [address]     Procedures and diagnostic studies:  DG HIP OPERATIVE UNILAT W OR W/O PELVIS RIGHT  Result Date: 08/28/2020 CLINICAL DATA:  Right femur IM nail. EXAM: OPERATIVE RIGHT HIP (WITH PELVIS IF PERFORMED) TECHNIQUE: Fluoroscopic spot image(s) were submitted for  interpretation post-operatively. COMPARISON:  Preoperative radiograph yesterday. FINDINGS: Five fluoroscopic spot views of the right hip obtained in the operating room. Intramedullary nail with trans trochanteric and distal locking screw traverse proximal  femur fracture in improved alignment. Total fluoroscopy time 3 minutes 24 seconds. IMPRESSION: Procedural fluoroscopy during ORIF proximal femur fracture. Electronically Signed   By: Keith Rake M.D.   On: 08/28/2020 17:06               LOS: 2 days   Melanie Malone  [facility]   Pager on [email or web address]. If 7PM-7AM, please contact night-coverage at [email or web address]     08/29/2020, 3:57 PM

## 2020-08-29 NOTE — Plan of Care (Signed)
Pt slowly progressing. Understands so far

## 2020-08-29 NOTE — TOC Progression Note (Signed)
Transition of Care [facility]) - Progression Note    Patient Details  Name: Melanie Malone MRN: [id number] Date of Birth: [birth year]  Transition of Care [facility]) CM/SW [place], RN Phone Number: 08/29/2020, 1:34 PM  Clinical Narrative:    Met with the patient to discuss DC plan and needs she lives at home with her husband and has an adult daughter that helps She would like to go to [facility], I explained the bedsearch process She is agreeable She had had both covid vaccines and a booster and will get the card to provide the facility of choice PASSR obtained, FL2 completed Bedsearch sent, I called Magda Paganini at [facility] asking to make a bed offer, the patient stated that she was told by her doctor that she would not DC prior to Tuesday, will review bed offers onve obtained        Expected Discharge Plan and Services                                                 Social Determinants of Health (SDOH) Interventions    Readmission Risk Interventions No flowsheet data found.

## 2020-08-29 NOTE — Evaluation (Signed)
Physical Therapy Evaluation Patient Details Name: Melanie Malone MRN: [id number] DOB: 1945/11/05 Today's Date: 08/29/2020   History of Present Illness  Pt is a 75 y.o. female presenting to hospital with R hip pain (progressively worsening last 3 weeks); R leg gave out walking upstairs (did not fall) and unable to walk since.  Imaging showing acute pathologic subtrochanteric fx of proximal R femur through  mixed lytic and sclerotic bone lesion; also additional predeominantly lytic bony lesions throughout visualized portion of pelvis including large lytic lesion within L iliac bone measuring about 7.0 cm.  S/p 5/26 R IMN intertrochantric secondary to R hip fx subtrochanteric pathologic.  PMH includes h/o breast CA s/p double mastectomy, HLD, htn, PONV, arthritis (knees, lower back).  Clinical Impression  Prior to hospital admission, pt was independent with ambulation; lives with her husband in 1 level apt (level entry).  Pt reporting 0/10 R hip/thigh pain at rest; increased R hip/thigh pain noted with functional activity during session; and 4-5/10 R hip/thigh pain end of session at rest.  Pt tolerated LE ex's in bed fairly well with assist for R LE as needed.  Currently pt is min to mod assist semi-supine to sitting edge of bed; mod assist x2 for transfers with RW use; and min assist x2 to ambulate a few feet bed to recliner with RW.  Extra time spent with functional mobility in general and with attempting to stand from bed (pt reporting being fearful of mobility/standing d/t R hip/thigh pain complicating mobility); unable to stand with 1 person assist so 2nd person assist requested and then able to stand up to RW with mod assist x2.  Pt would benefit from skilled PT to address noted impairments and functional limitations (see below for any additional details).  Upon hospital discharge, pt would benefit from SNF.    Follow Up Recommendations SNF    Equipment Recommendations  Rolling walker with 5"  wheels;3in1 (PT)    Recommendations for Other Services OT consult     Precautions / Restrictions Precautions Precautions: Fall Precaution Comments: No BP L UE Restrictions Weight Bearing Restrictions: Yes RLE Weight Bearing: Weight bearing as tolerated      Mobility  Bed Mobility Overal bed mobility: Needs Assistance Bed Mobility: Supine to Sit     Supine to sit: Min assist;Mod assist;HOB elevated     General bed mobility comments: assist for trunk and R LE; heavy use of bed rails; increased effort and time to perform d/t R hip pain    Transfers Overall transfer level: Needs assistance Equipment used: Rolling walker (2 wheeled) Transfers: Sit to/from Stand Sit to Stand: Mod assist;+2 physical assistance         General transfer comment: assist to initiate and come to full stand up to RW; vc's for UE/LE placement and overall technique  Ambulation/Gait Ambulation/Gait assistance: Min assist;+2 physical assistance Gait Distance (Feet): 3 Feet (bed to recliner) Assistive device: Rolling walker (2 wheeled)   Gait velocity: decreased   General Gait Details: antalgic; pt sliding feet on floor to advance LE's and able to take a few small steps with max cueing for technique and extra time  Stairs            Wheelchair Mobility    Modified Rankin (Stroke Patients Only)       Balance Overall balance assessment: Needs assistance Sitting-balance support: Feet supported;Single extremity supported Sitting balance-Leahy Scale: Fair Sitting balance - Comments: pt requiring at least single UE support for static sitting balance  Standing balance support: Bilateral upper extremity supported Standing balance-Leahy Scale: Poor Standing balance comment: pt requiring B UE support for static standing balance                             Pertinent Vitals/Pain Pain Assessment: 0-10 Pain Score: 5  Pain Location: R hip/thigh Pain Descriptors / Indicators:  Aching;Guarding;Sharp;Shooting Pain Intervention(s): Limited activity within patient's tolerance;Monitored during session;Premedicated before session;Repositioned;Other (comment) (RN notified of pt's pain levels after session.)  Vitals (HR and O2 on room air) stable and WFL throughout treatment session.    Home Living Family/patient expects to be discharged to:: Private residence Living Arrangements: Spouse/significant other   Type of Home: Apartment Home Access: Level entry     Home Layout: One [place];Shower seat      Prior Function Level of Independence: Independent         Comments: Pt reports 1 fall in February (fell on R side) and has had R hip pain since.  Pt cares for her husband who has significant health issues.     Hand Dominance        Extremity/Trunk Assessment   Upper Extremity Assessment Upper Extremity Assessment: Overall WFL for tasks assessed    Lower Extremity Assessment Lower Extremity Assessment: RLE deficits/detail (L LE WFL AROM) RLE Deficits / Details: fair R quad set isometric strength; at least 3/5 AROM ankle DF/PF; at least 2+/5 hip flexion (limited d/t R hip pain) RLE: Unable to fully assess due to pain    Cervical / Trunk Assessment Cervical / Trunk Assessment: Normal  Communication   Communication: No difficulties  Cognition Arousal/Alertness: Awake/alert Behavior During Therapy: Anxious Overall Cognitive Status: Within Functional Limits for tasks assessed                                        General Comments   Nursing cleared pt for participation in physical therapy.  Pt agreeable to PT session.    Exercises Total Joint Exercises Ankle Circles/Pumps: AROM;Strengthening;Both;10 reps;Supine Quad Sets: AROM;Strengthening;Both;10 reps;Supine Short Arc Quad: AAROM;Strengthening;Right;10 reps;Supine Heel Slides: AAROM;Strengthening;Right;10 reps;Supine Hip ABduction/ADduction:  AAROM;Strengthening;Right;10 reps;Supine   Assessment/Plan    PT Assessment Patient needs continued PT services  PT Problem List Decreased strength;Decreased activity tolerance;Decreased balance;Decreased mobility;Decreased range of motion;Decreased knowledge of use of DME;Decreased knowledge of precautions;Pain;Decreased skin integrity       PT Treatment Interventions DME instruction;Gait training;Functional mobility training;Therapeutic activities;Therapeutic exercise;Balance training;Patient/family education    PT Goals (Current goals can be found in the Care Plan section)  Acute Rehab PT Goals Patient Stated Goal: to go home PT Goal Formulation: With patient Time For Goal Achievement: 09/12/20 Potential to Achieve Goals: Fair    Frequency BID   Barriers to discharge Decreased caregiver support      Co-evaluation               AM-PAC PT "6 Clicks" Mobility  Outcome Measure Help needed turning from your back to your side while in a flat bed without using bedrails?: A Lot Help needed moving from lying on your back to sitting on the side of a flat bed without using bedrails?: A Lot Help needed moving to and from a bed to a chair (including a wheelchair)?: Total Help needed standing up from a chair using your arms (e.g., wheelchair or bedside chair)?:  Total Help needed to walk in hospital room?: Total Help needed climbing 3-5 steps with a railing? : Total 6 Click Score: 8    End of Session Equipment Utilized During Treatment: Gait belt Activity Tolerance: Patient limited by pain Patient left: in chair;with call bell/phone within reach;with chair alarm set;Other (comment) (B heels floating via pillow support; pt declined SCD's (bothered her foot)) Nurse Communication: Mobility status;Precautions;Weight bearing status;Other (comment) (pt's pain status) PT Visit Diagnosis: Other abnormalities of gait and mobility (R26.89);Muscle weakness (generalized) (M62.81);History of  falling (Z91.81);Pain Pain - Right/Left: Right Pain - part of body: Hip    Time: 2449-7530 PT Time Calculation (min) (ACUTE ONLY): 57 min   Charges:   PT Evaluation $PT Eval Low Complexity: 1 Low PT Treatments $Therapeutic Exercise: 8-22 mins $Therapeutic Activity: 23-37 mins       Marciano Mundt, PT 08/29/20, 11:09 AM

## 2020-08-29 NOTE — NC FL2 (Signed)
[place] LEVEL OF CARE SCREENING TOOL     IDENTIFICATION  Patient Name: Melanie Malone Birthdate: [birth year] Sex: female Admission Date (Current Location): 08/27/2020  [place] and [place] Number:  Engineering geologist and Address:  [facility], [address], [place],  [address]      Provider Number: [id number]  Attending Physician Name and Address:  Jennye Boroughs, MD  Relative Name and Phone Number:  Doren Custard [phone]    Current Level of Care: Hospital Recommended Level of Care: [place] Prior Approval Number:    Date Approved/Denied:   PASRR Number: [id number]  Discharge Plan: SNF    Current Diagnoses: Patient Active Problem List   Diagnosis Date Noted  . Bone lesion 08/28/2020  . Closed displaced subtrochanteric fracture of right femur ([place]) 08/27/2020  . Breast CA ([place])   . Obesity (BMI 30-39.9)   . Hypertension     Orientation RESPIRATION BLADDER Height & Weight     Self,Time,Situation,Place  Normal Continent Weight: 99.3 kg Height:  5\' 5"  (165.1 cm)  BEHAVIORAL SYMPTOMS/MOOD NEUROLOGICAL BOWEL NUTRITION STATUS      Continent Diet (Regular)  AMBULATORY STATUS COMMUNICATION OF NEEDS Skin   Extensive Assist   Surgical wounds                       Personal Care Assistance Level of Assistance  Bathing,Dressing Bathing Assistance: Limited assistance   Dressing Assistance: Limited assistance     Functional Limitations Info             SPECIAL CARE FACTORS FREQUENCY  PT (By licensed PT)     PT Frequency: 5 times per week              Contractures Contractures Info: Not present    Additional Factors Info  Code Status,Allergies Code Status Info: full code Allergies Info: Codeine, Scallops (Shellfish Allergy), Vicodin Hp (Hydrocodone-acetaminophen), Red Dye           Current Medications (08/29/2020):  This is the current hospital active medication list Current  Facility-Administered Medications  Medication Dose Route Frequency Provider Last Rate Last Admin  . 0.9 %  sodium chloride infusion   Intravenous Continuous Hessie Knows, MD      . acetaminophen (TYLENOL) tablet 500 mg  500 mg Oral Q6H PRN Hessie Knows, MD      . alum & mag hydroxide-simeth (MAALOX/MYLANTA) 200-200-20 MG/5ML suspension 30 mL  30 mL Oral Q4H PRN Hessie Knows, MD      . Benzocaine (HURRCAINE) 20 % mouth spray   Mouth/Throat QID PRN Jennye Boroughs, MD      . bisacodyl (DULCOLAX) suppository 10 mg  10 mg Rectal Daily PRN Hessie Knows, MD      . Chlorhexidine Gluconate Cloth 2 % PADS 6 each  6 each Topical Daily Hessie Knows, MD   6 each at 08/28/20 2035  . cholecalciferol (VITAMIN D3) tablet 1,000 Units  1,000 Units Oral Daily Hessie Knows, MD   1,000 Units at 08/29/20 0904  . enoxaparin (LOVENOX) injection 40 mg  40 mg Subcutaneous Q24H Hessie Knows, MD   40 mg at 08/29/20 0904  . lisinopril (ZESTRIL) tablet 20 mg  20 mg Oral Daily Hessie Knows, MD   20 mg at 08/29/20 0904  . magnesium citrate solution 1 Bottle  1 Bottle Oral Once PRN Hessie Knows, MD      . magnesium hydroxide (MILK OF MAGNESIA) suspension 30 mL  30 mL Oral  Daily PRN Hessie Knows, MD      . melatonin tablet 10 mg  10 mg Oral QHS Hessie Knows, MD      . methocarbamol (ROBAXIN) tablet 500 mg  500 mg Oral Q6H PRN Hessie Knows, MD   500 mg at 08/29/20 0354   Or  . methocarbamol (ROBAXIN) 500 mg in dextrose 5 % 50 mL IVPB  500 mg Intravenous Q6H PRN Hessie Knows, MD      . metoCLOPramide (REGLAN) tablet 5-10 mg  5-10 mg Oral Q8H PRN Hessie Knows, MD       Or  . metoCLOPramide (REGLAN) injection 5-10 mg  5-10 mg Intravenous Q8H PRN Hessie Knows, MD      . morphine 2 MG/ML injection 0.5 mg  0.5 mg Intravenous Q2H PRN Hessie Knows, MD   0.5 mg at 08/29/20 0749  . ondansetron (ZOFRAN) tablet 4 mg  4 mg Oral Q6H PRN Hessie Knows, MD       Or  . ondansetron [facility]) injection 4 mg  4 mg Intravenous Q6H PRN  Hessie Knows, MD      . ondansetron (ZOFRAN-ODT) disintegrating tablet 4 mg  4 mg Oral Q8H PRN Hessie Knows, MD   4 mg at 08/28/20 1359  . polyethylene glycol (MIRALAX / GLYCOLAX) packet 17 g  17 g Oral Daily Jennye Boroughs, MD   17 g at 08/29/20 0904  . potassium chloride SA (KLOR-CON) CR tablet 40 mEq  40 mEq Oral Once Hessie Knows, MD      . senna [facility]) tablet 8.6 mg  1 tablet Oral BID Hessie Knows, MD   8.6 mg at 08/29/20 0904  . simvastatin (ZOCOR) tablet 40 mg  40 mg Oral QHS Hessie Knows, MD   40 mg at 08/28/20 2113  . traMADol (ULTRAM) tablet 50 mg  50 mg Oral Q6H PRN Hessie Knows, MD   50 mg at 08/29/20 0911  . vitamin B-12 (CYANOCOBALAMIN) tablet 500 mcg  500 mcg Oral Daily Hessie Knows, MD   500 mcg at 08/29/20 5784     Discharge Medications: Please see discharge summary for a list of discharge medications.  Relevant Imaging Results:  Relevant Lab Results:   Additional Information SS# [id number]  Su Hilt, RN

## 2020-08-29 NOTE — Progress Notes (Signed)
   Subjective: 1 Day Post-Op Procedure(s) (LRB): INTRAMEDULLARY (IM) NAIL INTERTROCHANTRIC (Right) Patient reports pain as mild.   Patient is well, and has had no acute complaints or problems Denies any CP, SOB, ABD pain. We will continue therapy today.   Objective: Vital signs in last 24 hours: Temp:  [98 F (36.7 C)-99.8 F (37.7 C)] 98.4 F (36.9 C) (05/27 0831) Pulse Rate:  [74-95] 74 (05/27 0831) Resp:  [11-20] 18 (05/27 0831) BP: (112-136)/(64-88) 120/77 (05/27 0831) SpO2:  [93 %-97 %] 96 % (05/27 0831) FiO2 (%):  [21 %] 21 % (05/26 1715) Weight:  [99.3 kg] 99.3 kg (05/26 1442)  Intake/Output from previous day: 05/26 0701 - 05/27 0700 In: 400 [IV Piggyback:400] Out: 1350 [Urine:1050; Blood:300] Intake/Output this shift: No intake/output data recorded.  Recent Labs    08/27/20 1324 08/28/20 0415 08/29/20 0416  HGB 13.6 13.1 12.6   Recent Labs    08/28/20 0415 08/29/20 0416  WBC 9.4 10.8*  RBC 4.32 4.15  HCT 38.4 37.2  PLT 291 290   Recent Labs    08/28/20 0415 08/29/20 0416  NA 135 134*  K 3.5 3.8  CL 100 102  CO2 24 23  BUN 15 13  CREATININE 0.67 0.58  GLUCOSE 112* 137*  CALCIUM 9.1 8.6*   No results for input(s): LABPT, INR in the last 72 hours.  EXAM General - Patient is Alert, Appropriate and Oriented Extremity - Neurovascular intact Sensation intact distally Intact pulses distally Dorsiflexion/Plantar flexion intact No cellulitis present Compartment soft Dressing - dressing C/D/I and no drainage Motor Function - intact, moving foot and toes well on exam.   Past Medical History:  Diagnosis Date  . Arthritis    knees, lower back  . Breast CA ([place])   . Hyperlipidemia   . Hypertension   . PONV (postoperative nausea and vomiting)     Assessment/Plan:   1 Day Post-Op Procedure(s) (LRB): INTRAMEDULLARY (IM) NAIL INTERTROCHANTRIC (Right) Principal Problem:   Closed displaced subtrochanteric fracture of right femur (HCC) Active  Problems:   Breast CA (HCC)   Obesity (BMI 30-39.9)   Hypertension   Bone lesion  Estimated body mass index is 36.43 kg/m as calculated from the following:   Height as of this encounter: 5\' 5"  (1.651 m).   Weight as of this encounter: 99.3 kg. Advance diet Up with therapy  Pain well controlled Labs and vital signs are stable Work on bowel movement Care management to assist with discharge  Patient will need follow-up in 2 weeks with [facility] TED hose bilateral lower extremity x6 weeks Lovenox 40 mg subcu daily x14 days  DVT Prophylaxis - Lovenox, Foot Pumps and TED hose Weight-Bearing as tolerated to right leg   T. Rachelle Hora, PA-C [facility] 08/29/2020, 9:34 AM

## 2020-08-29 NOTE — Anesthesia Postprocedure Evaluation (Signed)
Anesthesia Post Note  Patient: Melanie Malone  Procedure(s) Performed: INTRAMEDULLARY (IM) NAIL INTERTROCHANTRIC (Right )  Patient location during evaluation: Nursing Unit Anesthesia Type: Spinal Level of consciousness: awake, awake and alert and oriented Pain management: pain level controlled Vital Signs Assessment: post-procedure vital signs reviewed and stable Respiratory status: spontaneous breathing, respiratory function stable and nonlabored ventilation Cardiovascular status: blood pressure returned to baseline and stable Postop Assessment: no headache, no backache, adequate PO intake and no apparent nausea or vomiting Anesthetic complications: no   No complications documented.   Last Vitals:  Vitals:   08/29/20 0352 08/29/20 0416  BP: 127/77   Pulse: 81   Resp: 20   Temp:  37.4 C  SpO2: 94%     Last Pain:  Vitals:   08/29/20 0749  TempSrc:   PainSc: 5                  [employer]

## 2020-08-29 NOTE — Progress Notes (Signed)
Melanie Malone   DOB:November 29, 1945   SL#:[id number]    Subjective: S/p surgery yesterday.  No acute events.  No nausea no vomiting.  Pain well controlled.  Awaiting physical therapy.  Objective:  Vitals:   08/29/20 0831 08/29/20 1604  BP: 120/77 (!) 102/56  Pulse: 74 85  Resp: 18 16  Temp: 98.4 F (36.9 C) 98.6 F (37 C)  SpO2: 96% 97%     Intake/Output Summary (Last 24 hours) at 08/29/2020 1606 Last data filed at 08/29/2020 1023 Gross per 24 hour  Intake 540 ml  Output 1000 ml  Net -460 ml    Physical Exam HENT:     Head: Normocephalic and atraumatic.     Mouth/Throat:     Pharynx: No oropharyngeal exudate.  Eyes:     Pupils: Pupils are equal, round, and reactive to light.  Cardiovascular:     Rate and Rhythm: Normal rate and regular rhythm.  Pulmonary:     Effort: Pulmonary effort is normal. No respiratory distress.     Breath sounds: Normal breath sounds. No wheezing.  Abdominal:     General: Bowel sounds are normal. There is no distension.     Palpations: Abdomen is soft. There is no mass.     Tenderness: There is no abdominal tenderness. There is no guarding or rebound.  Musculoskeletal:        General: No tenderness. Normal range of motion.     Cervical back: Normal range of motion and neck supple.  Skin:    General: Skin is warm.  Neurological:     Mental Status: She is alert and oriented to person, place, and time.  Psychiatric:        Mood and Affect: Affect normal.      Labs:  Lab Results  Component Value Date   WBC 10.8 (H) 08/29/2020   HGB 12.6 08/29/2020   HCT 37.2 08/29/2020   MCV 89.6 08/29/2020   PLT 290 08/29/2020   NEUTROABS 7.4 08/27/2020    Lab Results  Component Value Date   NA 134 (L) 08/29/2020   K 3.8 08/29/2020   CL 102 08/29/2020   CO2 23 08/29/2020    Studies:  DG HIP OPERATIVE UNILAT W OR W/O PELVIS RIGHT  Result Date: 08/28/2020 CLINICAL DATA:  Right femur IM nail. EXAM: OPERATIVE RIGHT HIP (WITH PELVIS IF PERFORMED)  TECHNIQUE: Fluoroscopic spot image(s) were submitted for interpretation post-operatively. COMPARISON:  Preoperative radiograph yesterday. FINDINGS: Five fluoroscopic spot views of the right hip obtained in the operating room. Intramedullary nail with trans trochanteric and distal locking screw traverse proximal femur fracture in improved alignment. Total fluoroscopy time 3 minutes 24 seconds. IMPRESSION: Procedural fluoroscopy during ORIF proximal femur fracture. Electronically Signed   By: Keith Rake M.D.   On: 08/28/2020 17:06    Bone lesion # 75 year old female patient with remote history of breast cancer currently admitted hospital for right femoral fracture/suspected pathologic  # Multiple bone lesions noted on CT of the right hip/pathology fracture right femur neck-highly concerning for malignancy/especially given history of breast cancer.  S/p ORIF; bone biopsy.  Discussed with Dr. Rudene Christians.  Awaiting breast cancer markers.  We will plan CT scan abdomen pelvis prior to discharge.  Patient also need PET scan; will need outpatient radiation to hip after healing.  #Above plan of care was discussed with patient in detail.  In agreement.  Cammie Sickle, MD 08/29/2020  4:06 PM

## 2020-08-29 NOTE — Progress Notes (Signed)
Physical Therapy Treatment Patient Details Name: Melanie Malone MRN: [id number] DOB: 11/14/1945 Today's Date: 08/29/2020    History of Present Illness Pt is a 75 y.o. female presenting to hospital with R hip pain (progressively worsening last 3 weeks); R leg gave out walking upstairs (did not fall) and unable to walk since.  Imaging showing acute pathologic subtrochanteric fx of proximal R femur through  mixed lytic and sclerotic bone lesion; also additional predeominantly lytic bony lesions throughout visualized portion of pelvis including large lytic lesion within L iliac bone measuring about 7.0 cm.  S/p 5/26 R IMN intertrochantric secondary to R hip fx subtrochanteric pathologic.  PMH includes h/o breast CA s/p double mastectomy, HLD, htn, PONV, arthritis (knees, lower back).    PT Comments    Pt sitting in recliner upon PT arrival; pt's daughter and pt's husband present.  0/10 R hip/thigh pain at rest beginning of session; R hip/thigh pain increased with activity; and 6/10 R hip/thigh pain end of session sitting edge of bed.  Tolerated sitting ex's with assist for R LE as needed.  Mod assist x2 to stand from recliner but progressed to min to mod assist x2 1st trial standing from bed and then min assist x2 2nd trial standing from bed (up to RW).  Pt able to walk a few feet recliner to bed with RW (CGA to min assist x2) with increased effort to take short steps.  Will continue to focus on strengthening and progressive functional mobility during hospitalization.  Pt sitting edge of bed with OT present for OT evaluation end of session.    Follow Up Recommendations  SNF     Equipment Recommendations  Rolling walker with 5" wheels;3in1 (PT)    Recommendations for Other Services OT consult     Precautions / Restrictions Precautions Precautions: Fall Precaution Comments: No BP L UE Restrictions Weight Bearing Restrictions: Yes RLE Weight Bearing: Weight bearing as tolerated    Mobility  Bed  Mobility         General bed mobility comments: deferred (pt sitting edge of bed with OT present end of session)    Transfers Overall transfer level: Needs assistance Equipment used: Rolling walker (2 wheeled) Transfers: Sit to/from Stand Sit to Stand: Min assist;Mod assist;+2 physical assistance         General transfer comment: mod assist x2 to stand from recliner; min to mod assist x2 to stand from bed 1st trial and min assist x2 to stand from bed 2nd trial; vc's for UE/LE placement, overall technique, and scooting to edge of sitting surface prior to standing  Ambulation/Gait Ambulation/Gait assistance: +2 physical assistance;Min guard;Min assist Gait Distance (Feet): 3 Feet (recliner to bed) Assistive device: Rolling walker (2 wheeled)   Gait velocity: decreased   General Gait Details: antalgic; decreased B LE step length/foot clearance; increased time to take short steps   Stairs             Wheelchair Mobility    Modified Rankin (Stroke Patients Only)       Balance Overall balance assessment: Needs assistance Sitting-balance support: Feet supported;Single extremity supported Sitting balance-Leahy Scale: Fair Sitting balance - Comments: pt requiring at least single UE support for static sitting balance   Standing balance support: Bilateral upper extremity supported Standing balance-Leahy Scale: Fair Standing balance comment: pt requiring B UE support for static standing balance  Cognition Arousal/Alertness: Awake/alert Behavior During Therapy: Anxious Overall Cognitive Status: Within Functional Limits for tasks assessed                                        Exercises General Exercises - Lower Extremity Ankle Circles/Pumps: AROM;Strengthening;Both;10 reps;Seated Long Arc Quad: AAROM;Right;AROM;Left;Strengthening;10 reps;Seated Hip Flexion/Marching: AAROM;Right;AROM;Left;Strengthening;10  reps;Seated    General Comments  Pt agreeable to PT session.      Pertinent Vitals/Pain Pain Assessment: 0-10 Pain Score: 6  Pain Location: R hip/thigh Pain Descriptors / Indicators: Aching;Guarding;Sharp;Shooting Pain Intervention(s): Limited activity within patient's tolerance;Monitored during session;Premedicated before session;Repositioned;Other (comment) (RN notified)  Vitals (HR and O2 on room air) stable and WFL throughout treatment session.    Home Living Family/patient expects to be discharged to:: Private residence Living Arrangements: Spouse/significant other   Type of Home: Apartment Home Access: Level entry   Home Layout: One [place];Shower seat      Prior Function Level of Independence: Independent      Comments: Pt reports 1 fall in February (fell on R side) and has had R hip pain since.  Pt cares for her husband who has significant health issues.   PT Goals (current goals can now be found in the care plan section) Acute Rehab PT Goals Patient Stated Goal: to go home PT Goal Formulation: With patient Time For Goal Achievement: 09/12/20 Potential to Achieve Goals: Fair Progress towards PT goals: Progressing toward goals    Frequency    BID      PT Plan Current plan remains appropriate    Co-evaluation              AM-PAC PT "6 Clicks" Mobility   Outcome Measure  Help needed turning from your back to your side while in a flat bed without using bedrails?: A Lot Help needed moving from lying on your back to sitting on the side of a flat bed without using bedrails?: A Lot Help needed moving to and from a bed to a chair (including a wheelchair)?: A Lot Help needed standing up from a chair using your arms (e.g., wheelchair or bedside chair)?: Total Help needed to walk in hospital room?: Total Help needed climbing 3-5 steps with a railing? : Total 6 Click Score: 9    End of Session Equipment Utilized During  Treatment: Gait belt Activity Tolerance: Patient limited by pain Patient left:  (sitting on edge of bed with OT present for OT session) Nurse Communication: Mobility status;Precautions;Weight bearing status;Other (comment) (pt's pain status) PT Visit Diagnosis: Other abnormalities of gait and mobility (R26.89);Muscle weakness (generalized) (M62.81);History of falling (Z91.81);Pain Pain - Right/Left: Right Pain - part of body: Hip     Time: 0932-6712 PT Time Calculation (min) (ACUTE ONLY): 30 min  Charges:  $Therapeutic Activity: 23-37 mins                    Leitha Bleak, PT 08/29/20, 2:12 PM

## 2020-08-29 NOTE — Progress Notes (Signed)
Patient unable to void. Bladder scan for 183. Will continue to monitor

## 2020-08-29 NOTE — Evaluation (Signed)
Occupational Therapy Evaluation Patient Details Name: Melanie Malone MRN: [id number] DOB: November 24, 1945 Today's Date: 08/29/2020    History of Present Illness Pt is a 75 y.o. female presenting to hospital with R hip pain (progressively worsening last 3 weeks); R leg gave out walking upstairs (did not fall) and unable to walk since.  Imaging showing acute pathologic subtrochanteric fx of proximal R femur through  mixed lytic and sclerotic bone lesion; also additional predeominantly lytic bony lesions throughout visualized portion of pelvis including large lytic lesion within L iliac bone measuring about 7.0 cm.  S/p 5/26 R IMN intertrochantric secondary to R hip fx subtrochanteric pathologic.  PMH includes h/o breast CA s/p double mastectomy, HLD, htn, PONV, arthritis (knees, lower back).   Clinical Impression   Ms Kleinschmidt was seen for OT evaluation this date. Prior to hospital admission, pt was MOD I for mobility and ADLs. Pt lives in apartment and is caregiver for her husband. Pt presents to acute OT demonstrating impaired ADL performance and functional mobility 2/2 decreased activity tolerance, functional strength/ROM/balance deficits, and pain. Pt currently requires SETUP + SUPERVISION grooming and UBD seated EOB. MAX A for LBD seated EOB. Anticipate +2 for ADL t/f. Pt would benefit from skilled OT to address noted impairments and functional limitations (see below for any additional details) in order to maximize safety and independence while minimizing falls risk and caregiver burden. Upon hospital discharge, recommend STR to maximize pt safety and return to PLOF.     Follow Up Recommendations  SNF    Equipment Recommendations  3 in 1 bedside commode    Recommendations for Other Services       Precautions / Restrictions Precautions Precautions: Fall Precaution Comments: No BP L UE Restrictions Weight Bearing Restrictions: Yes RLE Weight Bearing: Weight bearing as tolerated      Mobility  Bed Mobility Overal bed mobility: Needs Assistance Bed Mobility: Sit to Supine     Sit to supine: Mod assist   General bed mobility comments: increased time to initiate on her own    Transfers       General transfer comment: pt standing c PT upon arrival, defers further mobility    Balance Overall balance assessment: Needs assistance Sitting-balance support: Feet supported;Single extremity supported Sitting balance-Leahy Scale: Fair Sitting balance - Comments: no UE support reaching inside BOS   Standing balance support: Bilateral upper extremity supported Standing balance-Leahy Scale: Fair Standing balance comment: pt requiring B UE support for static standing balance                           ADL either performed or assessed with clinical judgement   ADL Overall ADL's : Needs assistance/impaired                                       General ADL Comments: SETUP + SUPERVISION grooming and UBD seated EOB. MAX A for LBD seated EOB. +2 for ADL t/f                  Pertinent Vitals/Pain Pain Assessment: 0-10 Pain Score: 6  Pain Location: R hip/thigh Pain Descriptors / Indicators: Aching;Guarding;Sharp;Shooting Pain Intervention(s): Limited activity within patient's tolerance;Monitored during session;Premedicated before session;Repositioned     Hand Dominance Right   Extremity/Trunk Assessment Upper Extremity Assessment Upper Extremity Assessment: Overall WFL for tasks assessed   Lower Extremity Assessment Lower  Extremity Assessment: Defer to PT evaluation RLE Deficits / Details: fair R quad set isometric strength; at least 3/5 AROM ankle DF/PF; at least 2+/5 hip flexion (limited d/t R hip pain) RLE: Unable to fully assess due to pain   Cervical / Trunk Assessment Cervical / Trunk Assessment: Normal   Communication Communication Communication: No difficulties   Cognition Arousal/Alertness: Awake/alert Behavior During Therapy:  Anxious Overall Cognitive Status: Within Functional Limits for tasks assessed                                     General Comments       Exercises Exercises: Other exercises Other Exercises Other Exercises: Pt educated re: OT role, DME recs, d/c recs, falls prevention, ECS, adapted dressing tehcniques Other Exercises: LBD, grooming, don/doff gown, sit>sup, sitting balance/tolerance   Shoulder Instructions      Home Living Family/patient expects to be discharged to:: Private residence Living Arrangements: Spouse/significant other Available Help at Discharge: Family Type of Home: Apartment Home Access: Level entry     Home Layout: One level     Bathroom Shower/Tub: Teacher, early years/pre: Handicapped height     Home Equipment: Grab bars - tub/shower;Shower seat          Prior Functioning/Environment Level of Independence: Independent        Comments: Pt reports 1 fall in February (fell on R side) and has had R hip pain since.  Pt cares for her husband who has significant health issues.        OT Problem List: Decreased strength;Decreased range of motion;Decreased activity tolerance;Impaired balance (sitting and/or standing);Decreased safety awareness;Decreased knowledge of use of DME or AE;Pain      OT Treatment/Interventions: Self-care/ADL training;Therapeutic exercise;Energy conservation;DME and/or AE instruction;Therapeutic activities;Patient/family education;Balance training    OT Goals(Current goals can be found in the care plan section) Acute Rehab OT Goals Patient Stated Goal: to go home OT Goal Formulation: With patient/family Time For Goal Achievement: 09/12/20 Potential to Achieve Goals: Good ADL Goals Pt Will Perform Grooming: standing;with min assist (c LRAD PRN) Pt Will Perform Lower Body Dressing: sitting/lateral leans;with mod assist (c LRAD PRN) Pt Will Transfer to Toilet: with min assist;stand pivot transfer;bedside  commode (c LRAD PRN)  OT Frequency: Min 1X/week   Barriers to D/C: Decreased caregiver support             AM-PAC OT "6 Clicks" Daily Activity     Outcome Measure Help from another person eating meals?: None Help from another person taking care of personal grooming?: A Little Help from another person toileting, which includes using toliet, bedpan, or urinal?: A Lot Help from another person bathing (including washing, rinsing, drying)?: A Lot Help from another person to put on and taking off regular upper body clothing?: A Little Help from another person to put on and taking off regular lower body clothing?: A Lot 6 Click Score: 16   End of Session    Activity Tolerance: Patient tolerated treatment well Patient left: in bed;with call bell/phone within reach;with family/visitor present (pt requested 4 bed rails up)  OT Visit Diagnosis: Other abnormalities of gait and mobility (R26.89)                Time: 1353-1416 OT Time Calculation (min): 23 min Charges:  OT General Charges $OT Visit: 1 Visit OT Evaluation $OT Eval Low Complexity: 1 Low OT Treatments $Self Care/Home  Management : 8-22 mins  Dessie Coma, M.S. OTR/L  08/29/20, 2:28 PM  ascom [phone]

## 2020-08-30 DIAGNOSIS — S7221XD Displaced subtrochanteric fracture of right femur, subsequent encounter for closed fracture with routine healing: Secondary | ICD-10-CM | POA: Diagnosis not present

## 2020-08-30 DIAGNOSIS — M899 Disorder of bone, unspecified: Secondary | ICD-10-CM | POA: Diagnosis not present

## 2020-08-30 LAB — CEA: CEA: 1.4 ng/mL (ref 0.0–4.7)

## 2020-08-30 LAB — CANCER ANTIGEN 15-3: CA 15-3: 76.9 U/mL — ABNORMAL HIGH (ref 0.0–25.0)

## 2020-08-30 LAB — CANCER ANTIGEN 27.29: CA 27.29: 95.4 U/mL — ABNORMAL HIGH (ref 0.0–38.6)

## 2020-08-30 NOTE — Progress Notes (Signed)
Melanie Malone   DOB:1945-08-18   KY#:[id number]    Subjective: No acute events overnight.  No nausea no vomiting.  Appetite is good.  She still able to walk with herself.  Working with physical therapy.  Objective:  Vitals:   08/30/20 1612 08/30/20 2053  BP: 121/64 116/67  Pulse: 84 82  Resp: 18 17  Temp: 98.1 F (36.7 C) 98.6 F (37 C)  SpO2: 94% 95%     Intake/Output Summary (Last 24 hours) at 08/30/2020 2315 Last data filed at 08/30/2020 1836 Gross per 24 hour  Intake 120 ml  Output 250 ml  Net -130 ml    Physical Exam Constitutional:      Comments: Patient sitting in the chair.  HENT:     Head: Normocephalic and atraumatic.     Mouth/Throat:     Pharynx: No oropharyngeal exudate.  Eyes:     Pupils: Pupils are equal, round, and reactive to light.  Cardiovascular:     Rate and Rhythm: Normal rate and regular rhythm.  Pulmonary:     Effort: Pulmonary effort is normal. No respiratory distress.     Breath sounds: Normal breath sounds. No wheezing.  Abdominal:     General: Bowel sounds are normal. There is no distension.     Palpations: Abdomen is soft. There is no mass.     Tenderness: There is no abdominal tenderness. There is no guarding or rebound.  Musculoskeletal:        General: No tenderness. Normal range of motion.     Cervical back: Normal range of motion and neck supple.     Comments: Limited mobility on the right lower extremity  Skin:    General: Skin is warm.  Neurological:     Mental Status: She is alert and oriented to person, place, and time.  Psychiatric:        Mood and Affect: Affect normal.      Labs:  Lab Results  Component Value Date   WBC 10.8 (H) 08/29/2020   HGB 12.6 08/29/2020   HCT 37.2 08/29/2020   MCV 89.6 08/29/2020   PLT 290 08/29/2020   NEUTROABS 7.4 08/27/2020    Lab Results  Component Value Date   NA 134 (L) 08/29/2020   K 3.8 08/29/2020   CL 102 08/29/2020   CO2 23 08/29/2020    Studies:  CT ABDOMEN PELVIS WO  CONTRAST  Result Date: 08/30/2020 CLINICAL DATA:  Breast cancer, staging EXAM: CT ABDOMEN AND PELVIS WITHOUT CONTRAST TECHNIQUE: Multidetector CT imaging of the abdomen and pelvis was performed following the standard protocol without IV contrast. COMPARISON:  Partial comparison to CT chest dated 08/27/2020 FINDINGS: Lower chest: Small left pleural effusion with associated left basilar atelectasis. Trace right pleural fluid. Hepatobiliary: Unenhanced liver is grossly unremarkable. Status post cholecystectomy. No intrahepatic or extrahepatic ductal dilatation. Pancreas: Within normal limits. Spleen: Within normal limits. Adrenals/Urinary Tract: Adrenal glands are within normal limits. Kidneys are within normal limits. No renal calculi or hydronephrosis. Trace nondependent gas in the bladder (series 2/image 36), likely related to instrumentation. Stomach/Bowel: Stomach is notable for a tiny hiatal hernia. No evidence of bowel obstruction. Normal appendix (series 2/image 51). Vascular/Lymphatic: No evidence of abdominal aortic aneurysm. Atherosclerotic calcifications of the abdominal aorta and branch vessels. No suspicious abdominopelvic lymphadenopathy. Reproductive: Status post hysterectomy. No adnexal masses. Other: No abdominopelvic ascites. Musculoskeletal: Bilateral breast augmentation, incompletely visualized. Grade 1 spondylolisthesis at L5-S1. Degenerative changes of the visualized thoracolumbar spine. Postprocedural changes related to ORIF of  the right hip. Lytic/sclerotic lesions involving the bilateral iliac bones (series 2/images 67 and 71) and the right proximal femur (series 2/image 97), raising concern for metastases given the clinical history. IMPRESSION: Lytic/sclerotic lesions involving the pelvis, raising concern for osseous metastases given the clinical history of breast cancer. Otherwise, no findings suspicious for metastatic disease in the abdomen/pelvis on unenhanced CT. Small left pleural  effusion with associated left lower lobe atelectasis. Electronically Signed   By: Julian Hy M.D.   On: 08/30/2020 05:39    Bone lesion # 75 year old female patient with remote history of breast cancer currently admitted hospital for right femoral fracture/suspected pathologic  # Multiple bone lesions noted on CT of the right hip/pathology fracture right femur neck-highly concerning for malignancy/especially given history of breast cancer.  S/p ORIF; bone biopsy.  Breast tumor markers elevated; suggestive of recurrent breast cancer.  However pathology still pending.  CT scan abdomen pelvis no evidence of any visceral disease.  Discussed that patient be candidate for postoperative Radiation.  Also need to be started on systemic therapy with-aromatase inhibitor plus CDK inhibitor.  Also plan for Zometa.  Cammie Sickle, MD 08/30/2020  11:15 PM

## 2020-08-30 NOTE — Progress Notes (Signed)
Physical Therapy Treatment Patient Details Name: Melanie Malone MRN: [id number] DOB: 1946-02-10 Today's Date: 08/30/2020    History of Present Illness Pt is a 75 y.o. female presenting to hospital with R hip pain (progressively worsening last 3 weeks); R leg gave out walking upstairs (did not fall) and unable to walk since.  Imaging showing acute pathologic subtrochanteric fx of proximal R femur through  mixed lytic and sclerotic bone lesion; also additional predeominantly lytic bony lesions throughout visualized portion of pelvis including large lytic lesion within L iliac bone measuring about 7.0 cm.  S/p 5/26 R IMN intertrochantric secondary to R hip fx subtrochanteric pathologic.  PMH includes h/o breast CA s/p double mastectomy, HLD, htn, PONV, arthritis (knees, lower back).    PT Comments    Pt resting in bed upon PT arrival; agreeable to PT session.  6/10 R hip/thigh pain at rest beginning/end of session (pt received pain medication prior to therapy session).  Mod assist semi-supine to sitting edge of bed; mod to max assist to stand up to RW (from bed); and min assist to transfer bed to recliner with RW use (pt with difficulty advancing B LE's this morning d/t R hip/thigh pain so pt pivoting on LE's more than taking steps).  Pt appearing anxious and fearful with activity (d/t pain) requiring extra time and encouragement for activities.  Will continue to focus on strengthening and progressive functional mobility per pt tolerance.    Follow Up Recommendations  SNF     Equipment Recommendations  Rolling walker with 5" wheels;3in1 (PT)    Recommendations for Other Services OT consult     Precautions / Restrictions Precautions Precautions: Fall Precaution Comments: No BP L UE Restrictions Weight Bearing Restrictions: Yes RLE Weight Bearing: Weight bearing as tolerated    Mobility  Bed Mobility Overal bed mobility: Needs Assistance Bed Mobility: Supine to Sit     Supine to sit: Mod  assist;HOB elevated     General bed mobility comments: assist for R LE and trunk; assist to scoot hips towards edge of bed using bed sheet    Transfers Overall transfer level: Needs assistance Equipment used: Rolling walker (2 wheeled) Transfers: Sit to/from Stand Sit to Stand: Mod assist;Max assist         General transfer comment: assist to initiate and come to full stand up to RW; assist to control descent sitting in recliner; vc's for UE/LE placement and overall technique  Ambulation/Gait Ambulation/Gait assistance: Min assist Gait Distance (Feet): 2 Feet (bed to recliner) Assistive device: Rolling walker (2 wheeled)   Gait velocity: decreased   General Gait Details: antalgic; pt with difficulty advancing B LE's so pt pivoting on LE's more than taking steps going bed to recliner; vc's for technique   Stairs             Wheelchair Mobility    Modified Rankin (Stroke Patients Only)       Balance Overall balance assessment: Needs assistance Sitting-balance support: Single extremity supported;Feet supported Sitting balance-Leahy Scale: Fair Sitting balance - Comments: steady sitting with at least single UE support   Standing balance support: Bilateral upper extremity supported Standing balance-Leahy Scale: Fair Standing balance comment: pt requiring B UE support for static standing balance                            Cognition Arousal/Alertness: Awake/alert Behavior During Therapy: Anxious Overall Cognitive Status: Within Functional Limits for tasks assessed  Exercises Total Joint Exercises Ankle Circles/Pumps: AROM;Strengthening;Both;10 reps;Supine Quad Sets: AROM;Strengthening;Both;10 reps;Supine Short Arc Quad: AAROM;Strengthening;Right;10 reps;Supine Heel Slides: AAROM;Strengthening;Right;10 reps;Supine Hip ABduction/ADduction: AAROM;Strengthening;Right;10 reps;Supine    General  Comments        Pertinent Vitals/Pain Pain Assessment: 0-10 Pain Score: 6  Pain Location: R hip/thigh Pain Descriptors / Indicators: Aching;Guarding;Sharp;Shooting Pain Intervention(s): Limited activity within patient's tolerance;Monitored during session;Premedicated before session;Repositioned;Other (comment) (RN notified)  Vitals (HR and O2 on room air) stable and WFL throughout treatment session.    Home Living                      Prior Function            PT Goals (current goals can now be found in the care plan section) Acute Rehab PT Goals Patient Stated Goal: to go home PT Goal Formulation: With patient Time For Goal Achievement: 09/12/20 Potential to Achieve Goals: Fair Progress towards PT goals: Progressing toward goals    Frequency    BID      PT Plan Current plan remains appropriate    Co-evaluation              AM-PAC PT "6 Clicks" Mobility   Outcome Measure  Help needed turning from your back to your side while in a flat bed without using bedrails?: A Lot Help needed moving from lying on your back to sitting on the side of a flat bed without using bedrails?: A Lot Help needed moving to and from a bed to a chair (including a wheelchair)?: A Little Help needed standing up from a chair using your arms (e.g., wheelchair or bedside chair)?: Total Help needed to walk in hospital room?: Total Help needed climbing 3-5 steps with a railing? : Total 6 Click Score: 10    End of Session Equipment Utilized During Treatment: Gait belt Activity Tolerance: Patient limited by pain Patient left: in chair;with call bell/phone within reach;with chair alarm set Nurse Communication: Mobility status;Precautions;Other (comment) (pt's pain status) PT Visit Diagnosis: Other abnormalities of gait and mobility (R26.89);Muscle weakness (generalized) (M62.81);History of falling (Z91.81);Pain Pain - Right/Left: Right Pain - part of body: Hip     Time:  1610-9604 PT Time Calculation (min) (ACUTE ONLY): 31 min  Charges:  $Therapeutic Exercise: 8-22 mins $Therapeutic Activity: 8-22 mins                    Leitha Bleak, PT 08/30/20, 9:12 AM

## 2020-08-30 NOTE — Progress Notes (Addendum)
Progress Note    Melanie Malone  KXF:[id number] DOB: [birth year]  DOA: 08/27/2020 PCP: Dion Body, MD      Brief Narrative:    Medical records reviewed and are as summarized below:  Melanie Malone is a 75 y.o. female several years ago, morbid obesity, who presented to the hospital because of severe right hip pain.  She said her right leg giving way while walking up the stairs and she could no longer ambulate.  She did not fall and was able to grab onto the rail and called out for help.  However, she mentioned that she fell about 3 months prior to admission and have been experiencing pain in the right hip for some time although she was able to ambulate at that time.  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    Assessment/Plan:   Principal Problem:   Closed displaced subtrochanteric fracture of right femur (HCC) Active Problems:   Breast CA (HCC)   Obesity (BMI 30-39.9)   Hypertension   Bone lesion   Body mass index is 36.43 kg/m.  (Morbid obesity)   Pathologic right femur fracture: S/p intramedullary nail intertrochanteric right hip.  Continue analgesics as needed for pain.  Follow-up with orthopedic surgeon.  Pathology report is pending.  Hypertension: Continue antihypertensives  Hypokalemia: Improved  Mild hyponatremia: Asymptomatic.  Remote history of left breast cancer s/p bilateral mastectomy and chemoradiation in 2003: CT abdomen pelvis showed lytic/sclerotic lesions involving the pelvis, raising concern for osseous metastasis.  CT chest showed 1 cm nodule in the lateral left lower lobe.  Repeat CT chest Recommended in the outpatient setting for follow-up.  Follow-up with oncologist.      Diet Order            Diet regular Room service appropriate? Yes; Fluid consistency: Thin  Diet effective now                    Consultants:  Orthopedic surgeon  Procedures:  Plan for hip surgery  today    Medications:   . Chlorhexidine Gluconate Cloth  6 each Topical Daily  . cholecalciferol  1,000 Units Oral Daily  . enoxaparin (LOVENOX) injection  40 mg Subcutaneous Q24H  . lisinopril  20 mg Oral Daily  . melatonin  10 mg Oral QHS  . polyethylene glycol  17 g Oral Daily  . potassium chloride  40 mEq Oral Once  . senna  1 tablet Oral BID  . simvastatin  40 mg Oral QHS  . vitamin B-12  500 mcg Oral Daily   Continuous Infusions: . sodium chloride    . methocarbamol (ROBAXIN) IV       Anti-infectives (From admission, onward)   Start     Dose/Rate Route Frequency Ordered Stop   08/28/20 2200  ceFAZolin (ANCEF) IVPB 2g/100 mL premix        2 g 200 mL/hr over 30 Minutes Intravenous Every 6 hours 08/28/20 1822 08/29/20 0939   08/28/20 1500  ceFAZolin (ANCEF) IVPB 2g/100 mL premix        2 g 200 mL/hr over 30 Minutes Intravenous  Once 08/27/20 1535 08/28/20 1559             Family Communication/Anticipated D/C date and plan/Code Status   DVT prophylaxis: enoxaparin (LOVENOX) injection 40 mg Start: 08/29/20 0800 Place TED hose Start: 08/28/20 1823 Foot Pump / plexipulse Start: 08/28/20 1823 SCDs Start: 08/27/20 1510     Code  Status: Full Code  Family Communication: None Disposition Plan:    Status is: Inpatient  Remains inpatient appropriate because:Inpatient level of care appropriate due to severity of illness   Dispo: The patient is from: Home              Anticipated d/c is to: SNF              Patient currently is not medically stable to d/c.   Difficult to place patient No           Subjective:   C/o right hip pain that is worse with activity.  She also complains of excessive sleepiness.  She has not moved her bowels.  She said it is not unusual for her to go for days without a bowel movement.  Objective:    Vitals:   08/29/20 2103 08/30/20 0031 08/30/20 0641 08/30/20 0752  BP: (!) 117/59 114/69 120/71 118/67  Pulse: 81 80 75 79   Resp: 17 17 17 20   Temp: 98.3 F (36.8 C) 98.5 F (36.9 C) 98 F (36.7 C) 97.9 F (36.6 C)  TempSrc: Oral Oral Oral Oral  SpO2: 95% 95% 96% 96%  Weight:      Height:       No data found.   Intake/Output Summary (Last 24 hours) at 08/30/2020 1533 Last data filed at 08/30/2020 1032 Gross per 24 hour  Intake 120 ml  Output 250 ml  Net -130 ml   Filed Weights   08/27/20 1319 08/28/20 1442  Weight: 99.3 kg 99.3 kg    Exam:  GEN: NAD SKIN: Warm and dry EYES: No pallor or icterus ENT: MMM CV: RRR PULM: CTA B ABD: soft, obese, NT, +BS CNS: AAO x 3, non focal EXT: Right hip tenderness.  Dressing on right hip surgical wound looks clean, dry and intact            Data Reviewed:   I have personally reviewed following labs and imaging studies:  Labs: Labs show the following:   Basic Metabolic Panel: Recent Labs  Lab 08/27/20 1324 08/28/20 0415 08/29/20 0416  NA 134* 135 134*  K 3.2* 3.5 3.8  CL 99 100 102  CO2 21* 24 23  GLUCOSE 113* 112* 137*  BUN 22 15 13   CREATININE 0.76 0.67 0.58  CALCIUM 9.7 9.1 8.6*   GFR Estimated Creatinine Clearance: 70.9 mL/min (by C-G formula based on SCr of 0.58 mg/dL). Liver Function Tests: No results for input(s): AST, ALT, ALKPHOS, BILITOT, PROT, ALBUMIN in the last 168 hours. No results for input(s): LIPASE, AMYLASE in the last 168 hours. No results for input(s): AMMONIA in the last 168 hours. Coagulation profile No results for input(s): INR, PROTIME in the last 168 hours.  CBC: Recent Labs  Lab 08/27/20 1324 08/28/20 0415 08/29/20 0416  WBC 10.3 9.4 10.8*  NEUTROABS 7.4  --   --   HGB 13.6 13.1 12.6  HCT 40.0 38.4 37.2  MCV 89.3 88.9 89.6  PLT 335 291 290   Cardiac Enzymes: No results for input(s): CKTOTAL, CKMB, CKMBINDEX, TROPONINI in the last 168 hours. BNP (last 3 results) No results for input(s): PROBNP in the last 8760 hours. CBG: No results for input(s): GLUCAP in the last 168  hours. D-Dimer: No results for input(s): DDIMER in the last 72 hours. Hgb A1c: No results for input(s): HGBA1C in the last 72 hours. Lipid Profile: No results for input(s): CHOL, HDL, LDLCALC, TRIG, CHOLHDL, LDLDIRECT in the last 72 hours.  Thyroid function studies: No results for input(s): TSH, T4TOTAL, T3FREE, THYROIDAB in the last 72 hours.  Invalid input(s): FREET3 Anemia work up: No results for input(s): VITAMINB12, FOLATE, FERRITIN, TIBC, IRON, RETICCTPCT in the last 72 hours. Sepsis Labs: Recent Labs  Lab 08/27/20 1324 08/28/20 0415 08/29/20 0416  WBC 10.3 9.4 10.8*    Microbiology Recent Results (from the past 240 hour(s))  Resp Panel by RT-PCR (Flu A&B, Covid) Nasopharyngeal Swab     Status: None   Collection Time: 08/27/20  3:15 PM   Specimen: Nasopharyngeal Swab; Nasopharyngeal(NP) swabs in vial transport medium  Result Value Ref Range Status   SARS Coronavirus 2 by RT PCR NEGATIVE NEGATIVE Final    Comment: (NOTE) SARS-CoV-2 target nucleic acids are NOT DETECTED.  The SARS-CoV-2 RNA is generally detectable in upper respiratory specimens during the acute phase of infection. The lowest concentration of SARS-CoV-2 viral copies this assay can detect is 138 copies/mL. A negative result does not preclude SARS-Cov-2 infection and should not be used as the sole basis for treatment or other patient management decisions. A negative result may occur with  improper specimen collection/handling, submission of specimen other than nasopharyngeal swab, presence of viral mutation(s) within the areas targeted by this assay, and inadequate number of viral copies(<138 copies/mL). A negative result must be combined with clinical observations, patient history, and epidemiological information. The expected result is Negative.  Fact Sheet for Patients:  EntrepreneurPulse.com.au  Fact Sheet for Healthcare Providers:   IncredibleEmployment.be  This test is no t yet approved or cleared by the [place] FDA and  has been authorized for detection and/or diagnosis of SARS-CoV-2 by FDA under an Emergency Use Authorization (EUA). This EUA will remain  in effect (meaning this test can be used) for the duration of the COVID-19 declaration under Section 564(b)(1) of the Act, 21 U.S.C.section 360bbb-3(b)(1), unless the authorization is terminated  or revoked sooner.       Influenza A by PCR NEGATIVE NEGATIVE Final   Influenza B by PCR NEGATIVE NEGATIVE Final    Comment: (NOTE) The Xpert Xpress SARS-CoV-2/FLU/RSV plus assay is intended as an aid in the diagnosis of influenza from Nasopharyngeal swab specimens and should not be used as a sole basis for treatment. Nasal washings and aspirates are unacceptable for Xpert Xpress SARS-CoV-2/FLU/RSV testing.  Fact Sheet for Patients: EntrepreneurPulse.com.au  Fact Sheet for Healthcare Providers: IncredibleEmployment.be  This test is not yet approved or cleared by the [place] FDA and has been authorized for detection and/or diagnosis of SARS-CoV-2 by FDA under an Emergency Use Authorization (EUA). This EUA will remain in effect (meaning this test can be used) for the duration of the COVID-19 declaration under Section 564(b)(1) of the Act, 21 U.S.C. section 360bbb-3(b)(1), unless the authorization is terminated or revoked.  Performed at [facility], [address] [place], [place] [address]   Surgical pcr screen     Status: None   Collection Time: 08/27/20 11:29 PM   Specimen: Nasal Mucosa; Nasal Swab  Result Value Ref Range Status   MRSA, PCR NEGATIVE NEGATIVE Final   Staphylococcus aureus NEGATIVE NEGATIVE Final    Comment: (NOTE) The Xpert SA Assay (FDA approved for NASAL specimens in patients 82 years of age and older), is one component of a comprehensive surveillance  program. It is not intended to diagnose infection nor to guide or monitor treatment. Performed at [facility], [address], [place], [place] [address]     Procedures and diagnostic studies:  CT ABDOMEN PELVIS WO CONTRAST  Result Date: 08/30/2020 CLINICAL DATA:  Breast cancer, staging EXAM: CT ABDOMEN AND PELVIS WITHOUT CONTRAST TECHNIQUE: Multidetector CT imaging of the abdomen and pelvis was performed following the standard protocol without IV contrast. COMPARISON:  Partial comparison to CT chest dated 08/27/2020 FINDINGS: Lower chest: Small left pleural effusion with associated left basilar atelectasis. Trace right pleural fluid. Hepatobiliary: Unenhanced liver is grossly unremarkable. Status post cholecystectomy. No intrahepatic or extrahepatic ductal dilatation. Pancreas: Within normal limits. Spleen: Within normal limits. Adrenals/Urinary Tract: Adrenal glands are within normal limits. Kidneys are within normal limits. No renal calculi or hydronephrosis. Trace nondependent gas in the bladder (series 2/image 36), likely related to instrumentation. Stomach/Bowel: Stomach is notable for a tiny hiatal hernia. No evidence of bowel obstruction. Normal appendix (series 2/image 51). Vascular/Lymphatic: No evidence of abdominal aortic aneurysm. Atherosclerotic calcifications of the abdominal aorta and branch vessels. No suspicious abdominopelvic lymphadenopathy. Reproductive: Status post hysterectomy. No adnexal masses. Other: No abdominopelvic ascites. Musculoskeletal: Bilateral breast augmentation, incompletely visualized. Grade 1 spondylolisthesis at L5-S1. Degenerative changes of the visualized thoracolumbar spine. Postprocedural changes related to ORIF of the right hip. Lytic/sclerotic lesions involving the bilateral iliac bones (series 2/images 67 and 71) and the right proximal femur (series 2/image 97), raising concern for metastases given the clinical history. IMPRESSION: Lytic/sclerotic  lesions involving the pelvis, raising concern for osseous metastases given the clinical history of breast cancer. Otherwise, no findings suspicious for metastatic disease in the abdomen/pelvis on unenhanced CT. Small left pleural effusion with associated left lower lobe atelectasis. Electronically Signed   By: Julian Hy M.D.   On: 08/30/2020 05:39   DG HIP OPERATIVE UNILAT W OR W/O PELVIS RIGHT  Result Date: 08/28/2020 CLINICAL DATA:  Right femur IM nail. EXAM: OPERATIVE RIGHT HIP (WITH PELVIS IF PERFORMED) TECHNIQUE: Fluoroscopic spot image(s) were submitted for interpretation post-operatively. COMPARISON:  Preoperative radiograph yesterday. FINDINGS: Five fluoroscopic spot views of the right hip obtained in the operating room. Intramedullary nail with trans trochanteric and distal locking screw traverse proximal femur fracture in improved alignment. Total fluoroscopy time 3 minutes 24 seconds. IMPRESSION: Procedural fluoroscopy during ORIF proximal femur fracture. Electronically Signed   By: Keith Rake M.D.   On: 08/28/2020 17:06               LOS: 3 days   Melanie Malone  [facility]   Pager on [email or web address]. If 7PM-7AM, please contact night-coverage at [email or web address]     08/30/2020, 3:33 PM

## 2020-08-30 NOTE — Progress Notes (Signed)
  Subjective: 2 Days Post-Op Procedure(s) (LRB): INTRAMEDULLARY (IM) NAIL INTERTROCHANTRIC (Right) Patient reports pain as well-controlled.   Patient is well, and has had no acute complaints or problems Plan is to go Skilled nursing facility after hospital stay. Negative for chest pain and shortness of breath Fever: no Gastrointestinal: negative for nausea and vomiting.  Patient has not had a bowel movement.  Objective: Vital signs in last 24 hours: Temp:  [97.9 F (36.6 C)-98.6 F (37 C)] 97.9 F (36.6 C) (05/28 0752) Pulse Rate:  [75-85] 79 (05/28 0752) Resp:  [16-20] 20 (05/28 0752) BP: (102-120)/(56-71) 118/67 (05/28 0752) SpO2:  [95 %-97 %] 96 % (05/28 0752)  Intake/Output from previous day:  Intake/Output Summary (Last 24 hours) at 08/30/2020 1218 Last data filed at 08/30/2020 1032 Gross per 24 hour  Intake 120 ml  Output 250 ml  Net -130 ml    Intake/Output this shift: Total I/O In: 120 [P.O.:120] Out: 250 [Urine:250]  Labs: Recent Labs    08/27/20 1324 08/28/20 0415 08/29/20 0416  HGB 13.6 13.1 12.6   Recent Labs    08/28/20 0415 08/29/20 0416  WBC 9.4 10.8*  RBC 4.32 4.15  HCT 38.4 37.2  PLT 291 290   Recent Labs    08/28/20 0415 08/29/20 0416  NA 135 134*  K 3.5 3.8  CL 100 102  CO2 24 23  BUN 15 13  CREATININE 0.67 0.58  GLUCOSE 112* 137*  CALCIUM 9.1 8.6*   No results for input(s): LABPT, INR in the last 72 hours.   EXAM General - Patient is Alert, Appropriate and Oriented Extremity - Neurovascular intact Dorsiflexion/Plantar flexion intact Compartment soft Dressing/Incision -Postoperative dressing remains in place. Motor Function - intact, moving foot and toes well on exam.     Assessment/Plan: 2 Days Post-Op Procedure(s) (LRB): INTRAMEDULLARY (IM) NAIL INTERTROCHANTRIC (Right) Principal Problem:   Closed displaced subtrochanteric fracture of right femur (HCC) Active Problems:   Breast CA (HCC)   Obesity (BMI 30-39.9)    Hypertension   Bone lesion  Estimated body mass index is 36.43 kg/m as calculated from the following:   Height as of this encounter: 5\' 5"  (1.651 m).   Weight as of this encounter: 99.3 kg. Advance diet Up with therapy  Labs reviewed.     DVT Prophylaxis - Lovenox, Ted hose and SCDs Weight-Bearing as tolerated to right leg  Cassell Smiles, PA-C [facility] 08/30/2020, 12:18 PM

## 2020-08-30 NOTE — Plan of Care (Signed)
  Problem: Education: Goal: Knowledge of General Education information will improve Description: Including pain rating scale, medication(s)/side effects and non-pharmacologic comfort measures Outcome: Progressing   Problem: Clinical Measurements: Goal: Ability to maintain clinical measurements within normal limits will improve Outcome: Progressing Goal: Will remain free from infection Outcome: Progressing Goal: Diagnostic test results will improve Outcome: Progressing Goal: Respiratory complications will improve Outcome: Progressing   Problem: Activity: Goal: Risk for activity intolerance will decrease Outcome: Progressing   Problem: Nutrition: Goal: Adequate nutrition will be maintained Outcome: Progressing   Problem: Elimination: Goal: Will not experience complications related to bowel motility Outcome: Progressing

## 2020-08-30 NOTE — Progress Notes (Signed)
Physical Therapy Treatment Patient Details Name: Melanie Malone MRN: [id number] DOB: 1945-08-25 Today's Date: 08/30/2020    History of Present Illness Pt is a 75 y.o. female presenting to hospital with R hip pain (progressively worsening last 3 weeks); R leg gave out walking upstairs (did not fall) and unable to walk since.  Imaging showing acute pathologic subtrochanteric fx of proximal R femur through  mixed lytic and sclerotic bone lesion; also additional predeominantly lytic bony lesions throughout visualized portion of pelvis including large lytic lesion within L iliac bone measuring about 7.0 cm.  S/p 5/26 R IMN intertrochantric secondary to R hip fx subtrochanteric pathologic.  PMH includes h/o breast CA s/p double mastectomy, HLD, htn, PONV, arthritis (knees, lower back).    PT Comments    Pt resting in recliner upon PT arrival; 7/10 R hip/thigh pain; agreeable to PT session.  Pt requiring a few attempts to stand from recliner (pt stopping each attempt initially d/t R hip/thigh pain) but eventually able to stand with mod to max assist x1 up to RW.  Then pt able to take short small steps with RW recliner to bed with min assist.  Able to stand 1x from bed with max assist x1 up to RW.  Pt then assisted back to bed and repositioned for comfort.  Pt reporting 6/10 R hip/thigh pain at rest end of session (charge nurse notified of pt's request for pain medication).  Will continue to focus on strengthening and progressive functional mobility during hospitalization.    Follow Up Recommendations  SNF     Equipment Recommendations  Rolling walker with 5" wheels;3in1 (PT)    Recommendations for Other Services OT consult     Precautions / Restrictions Precautions Precautions: Fall Precaution Comments: No BP L UE Restrictions Weight Bearing Restrictions: Yes RLE Weight Bearing: Weight bearing as tolerated    Mobility  Bed Mobility Overal bed mobility: Needs Assistance Bed Mobility: Sit to  Supine       Sit to supine: Min assist;Mod assist;HOB elevated   General bed mobility comments: assist for R>L LE; pt able to scoot up in bed with bed flat and therapist stabilizing L foot on bed to push with; use of bed rails    Transfers Overall transfer level: Needs assistance Equipment used: Rolling walker (2 wheeled) Transfers: Sit to/from Stand Sit to Stand: Mod assist;Max assist         General transfer comment: assist to initiate and come to full stand up to RW (x1 trial from recliner and x1 trial from bed); assist to control descent sitting onto bed; vc's for UE/LE placement and overall technique  Ambulation/Gait Ambulation/Gait assistance: Min assist Gait Distance (Feet): 3 Feet Assistive device: Rolling walker (2 wheeled)   Gait velocity: decreased   General Gait Details: antalgic; short steps; vc's for technique and walker use   Stairs             Wheelchair Mobility    Modified Rankin (Stroke Patients Only)       Balance Overall balance assessment: Needs assistance Sitting-balance support: No upper extremity supported;Feet supported Sitting balance-Leahy Scale: Fair Sitting balance - Comments: steady static sitting   Standing balance support: Bilateral upper extremity supported Standing balance-Leahy Scale: Fair Standing balance comment: pt requiring B UE support for static standing balance                            Cognition Arousal/Alertness: Awake/alert Behavior During Therapy: Anxious  Overall Cognitive Status: Within Functional Limits for tasks assessed                                        Exercises      General Comments  Pt agreeable to PT session.  Pt's husband present during session.      Pertinent Vitals/Pain Pain Assessment: 0-10 Pain Score: 6  Pain Location: R hip/thigh Pain Descriptors / Indicators: Aching;Guarding;Sharp;Shooting Pain Intervention(s): Limited activity within patient's  tolerance;Monitored during session;Premedicated before session;Repositioned;Patient requesting pain meds-RN notified  Vitals (HR and O2 on room air) stable and WFL throughout treatment session.    Home Living                      Prior Function            PT Goals (current goals can now be found in the care plan section) Acute Rehab PT Goals Patient Stated Goal: to go home PT Goal Formulation: With patient Time For Goal Achievement: 09/12/20 Potential to Achieve Goals: Fair Progress towards PT goals: Progressing toward goals    Frequency    BID      PT Plan Current plan remains appropriate    Co-evaluation              AM-PAC PT "6 Clicks" Mobility   Outcome Measure  Help needed turning from your back to your side while in a flat bed without using bedrails?: A Lot Help needed moving from lying on your back to sitting on the side of a flat bed without using bedrails?: A Lot Help needed moving to and from a bed to a chair (including a wheelchair)?: A Little Help needed standing up from a chair using your arms (e.g., wheelchair or bedside chair)?: A Lot Help needed to walk in hospital room?: Total Help needed climbing 3-5 steps with a railing? : Total 6 Click Score: 11    End of Session Equipment Utilized During Treatment: Gait belt Activity Tolerance: Patient limited by pain Patient left: in bed;with call bell/phone within reach;with bed alarm set;with family/visitor present;Other (comment) (R heel floating via pillow support) Nurse Communication: Mobility status;Precautions;Patient requests pain meds;Weight bearing status PT Visit Diagnosis: Other abnormalities of gait and mobility (R26.89);Muscle weakness (generalized) (M62.81);History of falling (Z91.81);Pain Pain - Right/Left: Right Pain - part of body: Hip     Time: 2979-8921 PT Time Calculation (min) (ACUTE ONLY): 38 min  Charges:  $Therapeutic Activity: 38-52 mins                    Leitha Bleak, PT 08/30/20, 2:20 PM

## 2020-08-31 DIAGNOSIS — M899 Disorder of bone, unspecified: Secondary | ICD-10-CM | POA: Diagnosis not present

## 2020-08-31 DIAGNOSIS — S7221XD Displaced subtrochanteric fracture of right femur, subsequent encounter for closed fracture with routine healing: Secondary | ICD-10-CM | POA: Diagnosis not present

## 2020-08-31 NOTE — Progress Notes (Signed)
Physical Therapy Treatment Patient Details Name: Melanie Malone MRN: [id number] DOB: [birth year] Today's Date: 08/31/2020    History of Present Illness Pt is a 75 y.o. female presenting to hospital with R hip pain (progressively worsening last 3 weeks); R leg gave out walking upstairs (did not fall) and unable to walk since.  Imaging showing acute pathologic subtrochanteric fx of proximal R femur through  mixed lytic and sclerotic bone lesion; also additional predeominantly lytic bony lesions throughout visualized portion of pelvis including large lytic lesion within L iliac bone measuring about 7.0 cm.  S/p 5/26 R IMN intertrochantric secondary to R hip fx subtrochanteric pathologic.  PMH includes h/o breast CA s/p double mastectomy, HLD, htn, PONV, arthritis (knees, lower back).    PT Comments    Ready for session.  Very slow progression to EOB but she is able to do with only light assist for LE and HOB raises.  Steady in sitting.  Stood with +2 assist and transferred to commode (R).  Large void but no BM.  Urine dark and encouraged to increase fluids.  She did state she is not drinking as much as at home and stated she would drink more.  Adequate water in room for access.  She is able to stand for care.  Mod a x 2 to stand and commode was removed from behind her with recliner brought up for her to sit.  Remained in chair with needs met.   Follow Up Recommendations  SNF     Equipment Recommendations  Rolling walker with 5" wheels;3in1 (PT)    Recommendations for Other Services       Precautions / Restrictions Precautions Precautions: Fall Precaution Comments: No BP L UE Restrictions Weight Bearing Restrictions: Yes RLE Weight Bearing: Weight bearing as tolerated    Mobility  Bed Mobility Overal bed mobility: Needs Assistance Bed Mobility: Supine to Sit     Supine to sit: Min guard;Min assist;HOB elevated     General bed mobility comments: significantly increased time but she is  able to generally get to EOB on her own with head raised and only light support for LLE.    Transfers Overall transfer level: Needs assistance Equipment used: Rolling walker (2 wheeled) Transfers: Sit to/from Stand Sit to Stand: Mod assist;+2 physical assistance;From elevated surface         General transfer comment: attempted with +1 but did need to call +2 in for.  She does exhibit fear which limites her ability.  Ambulation/Gait Ambulation/Gait assistance: Min assist;+2 physical assistance Gait Distance (Feet): 3 Feet Assistive device: Rolling walker (2 wheeled) Gait Pattern/deviations: Step-to pattern Gait velocity: decreased   General Gait Details: antalgic; short steps; vc's for technique and walker use   Stairs             Wheelchair Mobility    Modified Rankin (Stroke Patients Only)       Balance Overall balance assessment: Needs assistance Sitting-balance support: No upper extremity supported;Feet supported Sitting balance-Leahy Scale: Fair Sitting balance - Comments: steady static sitting   Standing balance support: Bilateral upper extremity supported Standing balance-Leahy Scale: Fair Standing balance comment: pt requiring B UE support for static standing balance                            Cognition Arousal/Alertness: Awake/alert Behavior During Therapy: Anxious Overall Cognitive Status: Within Functional Limits for tasks assessed  Exercises Other Exercises Other Exercises: pt was doing HEP on arrival with improved heel slides.  Seated LAQ Other Exercises: to commode to void    General Comments        Pertinent Vitals/Pain Pain Assessment: 0-10 Pain Score: 8  Pain Location: R hip/thigh Pain Descriptors / Indicators: Aching;Guarding;Sharp;Shooting Pain Intervention(s): Limited activity within patient's tolerance;Monitored during session;Repositioned    Home Living                       Prior Function            PT Goals (current goals can now be found in the care plan section) Progress towards PT goals: Progressing toward goals    Frequency    BID      PT Plan Current plan remains appropriate    Co-evaluation              AM-PAC PT "6 Clicks" Mobility   Outcome Measure  Help needed turning from your back to your side while in a flat bed without using bedrails?: A Lot Help needed moving from lying on your back to sitting on the side of a flat bed without using bedrails?: A Lot Help needed moving to and from a bed to a chair (including a wheelchair)?: A Lot Help needed standing up from a chair using your arms (e.g., wheelchair or bedside chair)?: A Lot Help needed to walk in hospital room?: A Lot Help needed climbing 3-5 steps with a railing? : Total 6 Click Score: 11    End of Session Equipment Utilized During Treatment: Gait belt Activity Tolerance: Patient tolerated treatment well Patient left: in chair;with call bell/phone within reach;with chair alarm set Nurse Communication: Mobility status;Precautions;Weight bearing status PT Visit Diagnosis: Other abnormalities of gait and mobility (R26.89);Muscle weakness (generalized) (M62.81);History of falling (Z91.81);Pain Pain - Right/Left: Right Pain - part of body: Hip     Time: 0156-1537 PT Time Calculation (min) (ACUTE ONLY): 46 min  Charges:  $Therapeutic Exercise: 8-22 mins $Therapeutic Activity: 23-37 mins                    Chesley Noon, PTA 08/31/20, 10:55 AM

## 2020-08-31 NOTE — Progress Notes (Signed)
Progress Note    Melanie Malone  GDJ:[id number] DOB: 09-01-1945  DOA: 08/27/2020 PCP: Dion Body, MD      Brief Narrative:    Medical records reviewed and are as summarized below:  Melanie Malone is a 75 y.o. female several years ago, morbid obesity, who presented to the hospital because of severe right hip pain.  She said her right leg giving way while walking up the stairs and she could no longer ambulate.  She did not fall and was able to grab onto the rail and called out for help.  However, she mentioned that she fell about 3 months prior to admission and have been experiencing pain in the right hip for some time although she was able to ambulate at that time.  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    Assessment/Plan:   Principal Problem:   Closed displaced subtrochanteric fracture of right femur (HCC) Active Problems:   Breast CA (HCC)   Obesity (BMI 30-39.9)   Hypertension   Bone lesion   Body mass index is 36.43 kg/m.  (Morbid obesity)   Pathologic right femur fracture: S/p intramedullary nail intertrochanteric right hip on 08/28/2020.  Continue analgesics for pain.  Follow-up with orthopedic surgeon.  Pathology report is pending.  Hypertension: Continue antihypertensives  Hypokalemia: Improved  Mild hyponatremia: Asymptomatic.  Remote history of left breast cancer s/p bilateral mastectomy and chemoradiation in 2003: CT abdomen pelvis showed lytic/sclerotic lesions involving the pelvis, raising concern for osseous metastasis.  Tumor markers are elevated (CA 15-3: 76.9, CA 27.29: 95.4). CT chest showed 1 cm nodule in the lateral left lower lobe.  Repeat CT chest Recommended in the outpatient setting for follow-up.  Follow-up with oncologist.      Diet Order            Diet regular Room service appropriate? Yes; Fluid consistency: Thin  Diet effective now                     Consultants:  Orthopedic surgeon  Procedures:  Plan for hip surgery today    Medications:   . Chlorhexidine Gluconate Cloth  6 each Topical Daily  . cholecalciferol  1,000 Units Oral Daily  . enoxaparin (LOVENOX) injection  40 mg Subcutaneous Q24H  . lisinopril  20 mg Oral Daily  . melatonin  10 mg Oral QHS  . polyethylene glycol  17 g Oral Daily  . potassium chloride  40 mEq Oral Once  . senna  1 tablet Oral BID  . simvastatin  40 mg Oral QHS  . vitamin B-12  500 mcg Oral Daily   Continuous Infusions: . sodium chloride    . methocarbamol (ROBAXIN) IV       Anti-infectives (From admission, onward)   Start     Dose/Rate Route Frequency Ordered Stop   08/28/20 2200  ceFAZolin (ANCEF) IVPB 2g/100 mL premix        2 g 200 mL/hr over 30 Minutes Intravenous Every 6 hours 08/28/20 1822 08/29/20 0939   08/28/20 1500  ceFAZolin (ANCEF) IVPB 2g/100 mL premix        2 g 200 mL/hr over 30 Minutes Intravenous  Once 08/27/20 1535 08/28/20 1559             Family Communication/Anticipated D/C date and plan/Code Status   DVT prophylaxis: Place and maintain sequential compression device Start: 08/31/20 1002 enoxaparin (LOVENOX) injection 40 mg Start: 08/29/20 0800 Place TED hose  Start: 08/28/20 1823 SCDs Start: 08/27/20 1510     Code Status: Full Code  Family Communication: None Disposition Plan:    Status is: Inpatient  Remains inpatient appropriate because:Inpatient level of care appropriate due to severity of illness   Dispo: The patient is from: Home              Anticipated d/c is to: SNF              Patient currently is not medically stable to d/c.   Difficult to place patient No           Subjective:   C/o right hip pain.  She feels less depressed and less sleepy today.  PT was working with her at the bedside.  Objective:    Vitals:   08/30/20 0752 08/30/20 1612 08/30/20 2053 08/31/20 0453  BP: 118/67 121/64 116/67 130/74   Pulse: 79 84 82 78  Resp: 20 18 17 16   Temp: 97.9 F (36.6 C) 98.1 F (36.7 C) 98.6 F (37 C) 99 F (37.2 C)  TempSrc: Oral Oral Oral Oral  SpO2: 96% 94% 95% 94%  Weight:      Height:       No data found.   Intake/Output Summary (Last 24 hours) at 08/31/2020 1510 Last data filed at 08/31/2020 1353 Gross per 24 hour  Intake 120 ml  Output --  Net 120 ml   Filed Weights   08/27/20 1319 08/28/20 1442  Weight: 99.3 kg 99.3 kg    Exam:  GEN: NAD SKIN: Warm and dry EYES: No pallor or icterus ENT: MMM CV: RRR PULM: CTA B ABD: soft, obese, NT, +BS CNS: AAO x 3, non focal EXT: Right hip tenderness.  Dressing on right hip surgical wound looks clean, dry and intact.           Data Reviewed:   I have personally reviewed following labs and imaging studies:  Labs: Labs show the following:   Basic Metabolic Panel: Recent Labs  Lab 08/27/20 1324 08/28/20 0415 08/29/20 0416  NA 134* 135 134*  K 3.2* 3.5 3.8  CL 99 100 102  CO2 21* 24 23  GLUCOSE 113* 112* 137*  BUN 22 15 13   CREATININE 0.76 0.67 0.58  CALCIUM 9.7 9.1 8.6*   GFR Estimated Creatinine Clearance: 70.9 mL/min (by C-G formula based on SCr of 0.58 mg/dL). Liver Function Tests: No results for input(s): AST, ALT, ALKPHOS, BILITOT, PROT, ALBUMIN in the last 168 hours. No results for input(s): LIPASE, AMYLASE in the last 168 hours. No results for input(s): AMMONIA in the last 168 hours. Coagulation profile No results for input(s): INR, PROTIME in the last 168 hours.  CBC: Recent Labs  Lab 08/27/20 1324 08/28/20 0415 08/29/20 0416  WBC 10.3 9.4 10.8*  NEUTROABS 7.4  --   --   HGB 13.6 13.1 12.6  HCT 40.0 38.4 37.2  MCV 89.3 88.9 89.6  PLT 335 291 290   Cardiac Enzymes: No results for input(s): CKTOTAL, CKMB, CKMBINDEX, TROPONINI in the last 168 hours. BNP (last 3 results) No results for input(s): PROBNP in the last 8760 hours. CBG: No results for input(s): GLUCAP in the last 168  hours. D-Dimer: No results for input(s): DDIMER in the last 72 hours. Hgb A1c: No results for input(s): HGBA1C in the last 72 hours. Lipid Profile: No results for input(s): CHOL, HDL, LDLCALC, TRIG, CHOLHDL, LDLDIRECT in the last 72 hours. Thyroid function studies: No results for input(s): TSH, T4TOTAL,  T3FREE, THYROIDAB in the last 72 hours.  Invalid input(s): FREET3 Anemia work up: No results for input(s): VITAMINB12, FOLATE, FERRITIN, TIBC, IRON, RETICCTPCT in the last 72 hours. Sepsis Labs: Recent Labs  Lab 08/27/20 1324 08/28/20 0415 08/29/20 0416  WBC 10.3 9.4 10.8*    Microbiology Recent Results (from the past 240 hour(s))  Resp Panel by RT-PCR (Flu A&B, Covid) Nasopharyngeal Swab     Status: None   Collection Time: 08/27/20  3:15 PM   Specimen: Nasopharyngeal Swab; Nasopharyngeal(NP) swabs in vial transport medium  Result Value Ref Range Status   SARS Coronavirus 2 by RT PCR NEGATIVE NEGATIVE Final    Comment: (NOTE) SARS-CoV-2 target nucleic acids are NOT DETECTED.  The SARS-CoV-2 RNA is generally detectable in upper respiratory specimens during the acute phase of infection. The lowest concentration of SARS-CoV-2 viral copies this assay can detect is 138 copies/mL. A negative result does not preclude SARS-Cov-2 infection and should not be used as the sole basis for treatment or other patient management decisions. A negative result may occur with  improper specimen collection/handling, submission of specimen other than nasopharyngeal swab, presence of viral mutation(s) within the areas targeted by this assay, and inadequate number of viral copies(<138 copies/mL). A negative result must be combined with clinical observations, patient history, and epidemiological information. The expected result is Negative.  Fact Sheet for Patients:  EntrepreneurPulse.com.au  Fact Sheet for Healthcare Providers:   IncredibleEmployment.be  This test is no t yet approved or cleared by the [place] FDA and  has been authorized for detection and/or diagnosis of SARS-CoV-2 by FDA under an Emergency Use Authorization (EUA). This EUA will remain  in effect (meaning this test can be used) for the duration of the COVID-19 declaration under Section 564(b)(1) of the Act, 21 U.S.C.section 360bbb-3(b)(1), unless the authorization is terminated  or revoked sooner.       Influenza A by PCR NEGATIVE NEGATIVE Final   Influenza B by PCR NEGATIVE NEGATIVE Final    Comment: (NOTE) The Xpert Xpress SARS-CoV-2/FLU/RSV plus assay is intended as an aid in the diagnosis of influenza from Nasopharyngeal swab specimens and should not be used as a sole basis for treatment. Nasal washings and aspirates are unacceptable for Xpert Xpress SARS-CoV-2/FLU/RSV testing.  Fact Sheet for Patients: EntrepreneurPulse.com.au  Fact Sheet for Healthcare Providers: IncredibleEmployment.be  This test is not yet approved or cleared by the [place] FDA and has been authorized for detection and/or diagnosis of SARS-CoV-2 by FDA under an Emergency Use Authorization (EUA). This EUA will remain in effect (meaning this test can be used) for the duration of the COVID-19 declaration under Section 564(b)(1) of the Act, 21 U.S.C. section 360bbb-3(b)(1), unless the authorization is terminated or revoked.  Performed at [facility], [address] [place], [place] [address]   Surgical pcr screen     Status: None   Collection Time: 08/27/20 11:29 PM   Specimen: Nasal Mucosa; Nasal Swab  Result Value Ref Range Status   MRSA, PCR NEGATIVE NEGATIVE Final   Staphylococcus aureus NEGATIVE NEGATIVE Final    Comment: (NOTE) The Xpert SA Assay (FDA approved for NASAL specimens in patients 54 years of age and older), is one component of a comprehensive surveillance  program. It is not intended to diagnose infection nor to guide or monitor treatment. Performed at [facility], [address] [place],  [address]     Procedures and diagnostic studies:  CT ABDOMEN PELVIS WO CONTRAST  Result Date: 08/30/2020  CLINICAL DATA:  Breast cancer, staging EXAM: CT ABDOMEN AND PELVIS WITHOUT CONTRAST TECHNIQUE: Multidetector CT imaging of the abdomen and pelvis was performed following the standard protocol without IV contrast. COMPARISON:  Partial comparison to CT chest dated 08/27/2020 FINDINGS: Lower chest: Small left pleural effusion with associated left basilar atelectasis. Trace right pleural fluid. Hepatobiliary: Unenhanced liver is grossly unremarkable. Status post cholecystectomy. No intrahepatic or extrahepatic ductal dilatation. Pancreas: Within normal limits. Spleen: Within normal limits. Adrenals/Urinary Tract: Adrenal glands are within normal limits. Kidneys are within normal limits. No renal calculi or hydronephrosis. Trace nondependent gas in the bladder (series 2/image 36), likely related to instrumentation. Stomach/Bowel: Stomach is notable for a tiny hiatal hernia. No evidence of bowel obstruction. Normal appendix (series 2/image 51). Vascular/Lymphatic: No evidence of abdominal aortic aneurysm. Atherosclerotic calcifications of the abdominal aorta and branch vessels. No suspicious abdominopelvic lymphadenopathy. Reproductive: Status post hysterectomy. No adnexal masses. Other: No abdominopelvic ascites. Musculoskeletal: Bilateral breast augmentation, incompletely visualized. Grade 1 spondylolisthesis at L5-S1. Degenerative changes of the visualized thoracolumbar spine. Postprocedural changes related to ORIF of the right hip. Lytic/sclerotic lesions involving the bilateral iliac bones (series 2/images 67 and 71) and the right proximal femur (series 2/image 97), raising concern for metastases given the clinical history. IMPRESSION: Lytic/sclerotic  lesions involving the pelvis, raising concern for osseous metastases given the clinical history of breast cancer. Otherwise, no findings suspicious for metastatic disease in the abdomen/pelvis on unenhanced CT. Small left pleural effusion with associated left lower lobe atelectasis. Electronically Signed   By: Julian Hy M.D.   On: 08/30/2020 05:39               LOS: 4 days   Ajanay Farve  [facility]   Pager on [email or web address]. If 7PM-7AM, please contact night-coverage at [email or web address]     08/31/2020, 3:10 PM

## 2020-08-31 NOTE — Progress Notes (Signed)
  Subjective: 3 Days Post-Op Procedure(s) (LRB): INTRAMEDULLARY (IM) NAIL INTERTROCHANTRIC (Right) Patient reports pain as well-controlled.   Patient is well, and has had no acute complaints or problems. She reports feeling in better spirits than yesterday. Plan is to go Skilled nursing facility after hospital stay. Negative for chest pain and shortness of breath Fever: no Gastrointestinal: negative for nausea and vomiting.  Patient has not had a bowel movement.  Objective: Vital signs in last 24 hours: Temp:  [98.1 F (36.7 C)-99 F (37.2 C)] 99 F (37.2 C) (05/29 0453) Pulse Rate:  [78-84] 78 (05/29 0453) Resp:  [16-18] 16 (05/29 0453) BP: (116-130)/(64-74) 130/74 (05/29 0453) SpO2:  [94 %-95 %] 94 % (05/29 0453)  Intake/Output from previous day:  Intake/Output Summary (Last 24 hours) at 08/31/2020 0956 Last data filed at 08/30/2020 1836 Gross per 24 hour  Intake 120 ml  Output --  Net 120 ml    Intake/Output this shift: No intake/output data recorded.  Labs: Recent Labs    08/29/20 0416  HGB 12.6   Recent Labs    08/29/20 0416  WBC 10.8*  RBC 4.15  HCT 37.2  PLT 290   Recent Labs    08/29/20 0416  NA 134*  K 3.8  CL 102  CO2 23  BUN 13  CREATININE 0.58  GLUCOSE 137*  CALCIUM 8.6*   No results for input(s): LABPT, INR in the last 72 hours.   EXAM General - Patient is Alert, Appropriate and Oriented Extremity - Neurovascular intact Dorsiflexion/Plantar flexion intact Compartment soft Dressing/Incision -Post-op dressings remain intact. No drainage noted. Motor Function - intact, moving foot and toes well on exam.     Assessment/Plan: 3 Days Post-Op Procedure(s) (LRB): INTRAMEDULLARY (IM) NAIL INTERTROCHANTRIC (Right) Principal Problem:   Closed displaced subtrochanteric fracture of right femur (HCC) Active Problems:   Breast CA (HCC)   Obesity (BMI 30-39.9)   Hypertension   Bone lesion  Estimated body mass index is 36.43 kg/m as  calculated from the following:   Height as of this encounter: 5\' 5"  (1.651 m).   Weight as of this encounter: 99.3 kg. Advance diet Up with therapy  Transition to SCDs as patient will not tolerate foot pumps.    Plan to change dressings tomorrow.   DVT Prophylaxis - Lovenox, Ted hose and SCDs Weight-Bearing as tolerated to right leg  Cassell Smiles, PA-C [facility] 08/31/2020, 9:56 AM

## 2020-08-31 NOTE — Progress Notes (Signed)
Physical Therapy Treatment Patient Details Name: Melanie Malone MRN: [id number] DOB: Jul 05, 1945 Today's Date: 08/31/2020    History of Present Illness Pt is a 75 y.o. female presenting to hospital with R hip pain (progressively worsening last 3 weeks); R leg gave out walking upstairs (did not fall) and unable to walk since.  Imaging showing acute pathologic subtrochanteric fx of proximal R femur through  mixed lytic and sclerotic bone lesion; also additional predeominantly lytic bony lesions throughout visualized portion of pelvis including large lytic lesion within L iliac bone measuring about 7.0 cm.  S/p 5/26 R IMN intertrochantric secondary to R hip fx subtrochanteric pathologic.  PMH includes h/o breast CA s/p double mastectomy, HLD, htn, PONV, arthritis (knees, lower back).    PT Comments    Returned to assist pt back to bed with tech given increased time needs, pain and cues.    Follow Up Recommendations  SNF     Equipment Recommendations  Rolling walker with 5" wheels;3in1 (PT)    Recommendations for Other Services       Precautions / Restrictions Precautions Precautions: Fall Precaution Comments: No BP L UE Restrictions Weight Bearing Restrictions: Yes RLE Weight Bearing: Weight bearing as tolerated    Mobility  Bed Mobility Overal bed mobility: Needs Assistance Bed Mobility: Sit to Supine     Supine to sit: Min guard;Min assist;HOB elevated Sit to supine: Mod assist;HOB elevated   General bed mobility comments: significantly increased time but she is able to generally get to EOB on her own with head raised and only light support for LLE.    Transfers Overall transfer level: Needs assistance Equipment used: Rolling walker (2 wheeled) Transfers: Sit to/from Stand Sit to Stand: Mod assist;+2 physical assistance         General transfer comment: attempted with +1 but did need to call +2 in for.  She does exhibit fear which limites her  ability.  Ambulation/Gait Ambulation/Gait assistance: Min assist;+2 physical assistance Gait Distance (Feet): 3 Feet Assistive device: Rolling walker (2 wheeled) Gait Pattern/deviations: Step-to pattern Gait velocity: decreased   General Gait Details: antalgic; short steps; vc's for technique and walker use   Stairs             Wheelchair Mobility    Modified Rankin (Stroke Patients Only)       Balance Overall balance assessment: Needs assistance Sitting-balance support: No upper extremity supported;Feet supported Sitting balance-Leahy Scale: Fair Sitting balance - Comments: steady static sitting   Standing balance support: Bilateral upper extremity supported Standing balance-Leahy Scale: Fair Standing balance comment: pt requiring B UE support for static standing balance                            Cognition Arousal/Alertness: Awake/alert Behavior During Therapy: Anxious Overall Cognitive Status: Within Functional Limits for tasks assessed                                        Exercises Other Exercises Other Exercises: pt was doing HEP on arrival with improved heel slides.  Seated LAQ Other Exercises: to commode to void    General Comments        Pertinent Vitals/Pain Pain Assessment: 0-10 Pain Score: 9  Pain Location: R hip/thigh Pain Descriptors / Indicators: Aching;Guarding;Sharp;Shooting Pain Intervention(s): Limited activity within patient's tolerance;Monitored during session;Repositioned;Patient requesting pain meds-RN notified  Home Living                      Prior Function            PT Goals (current goals can now be found in the care plan section) Progress towards PT goals: Progressing toward goals    Frequency    BID      PT Plan Current plan remains appropriate    Co-evaluation              AM-PAC PT "6 Clicks" Mobility   Outcome Measure  Help needed turning from your back to  your side while in a flat bed without using bedrails?: A Lot Help needed moving from lying on your back to sitting on the side of a flat bed without using bedrails?: A Lot Help needed moving to and from a bed to a chair (including a wheelchair)?: A Lot Help needed standing up from a chair using your arms (e.g., wheelchair or bedside chair)?: A Lot Help needed to walk in hospital room?: A Lot Help needed climbing 3-5 steps with a railing? : Total 6 Click Score: 11    End of Session Equipment Utilized During Treatment: Gait belt Activity Tolerance: Patient tolerated treatment well Patient left: in chair;with call bell/phone within reach;with chair alarm set Nurse Communication: Mobility status;Precautions;Weight bearing status PT Visit Diagnosis: Other abnormalities of gait and mobility (R26.89);Muscle weakness (generalized) (M62.81);History of falling (Z91.81);Pain Pain - Right/Left: Right Pain - part of body: Hip     Time: 7169-6789 PT Time Calculation (min) (ACUTE ONLY): 13 min  Charges:  $Therapeutic Exercise: 8-22 mins $Therapeutic Activity: 8-22 mins                    Chesley Noon, PTA 08/31/20, 1:14 PM

## 2020-09-01 DIAGNOSIS — S7221XD Displaced subtrochanteric fracture of right femur, subsequent encounter for closed fracture with routine healing: Secondary | ICD-10-CM | POA: Diagnosis not present

## 2020-09-01 DIAGNOSIS — M899 Disorder of bone, unspecified: Secondary | ICD-10-CM | POA: Diagnosis not present

## 2020-09-01 DIAGNOSIS — I10 Essential (primary) hypertension: Secondary | ICD-10-CM | POA: Diagnosis not present

## 2020-09-01 NOTE — Progress Notes (Addendum)
Progress Note    Melanie Malone  FXT:[id number] DOB: 23-Oct-1945  DOA: 08/27/2020 PCP: Dion Body, MD      Brief Narrative:    Medical records reviewed and are as summarized below:  Melanie Malone is a 75 y.o. female several years ago, morbid obesity, who presented to the hospital because of severe right hip pain.  She said her right leg giving way while walking up the stairs and she could no longer ambulate.  She did not fall and was able to grab onto the rail and called out for help.  However, she mentioned that she fell about 3 months prior to admission and have been experiencing pain in the right hip for some time although she was able to ambulate at that time.  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    Assessment/Plan:   Principal Problem:   Closed displaced subtrochanteric fracture of right femur (HCC) Active Problems:   Breast CA (HCC)   Obesity (BMI 30-39.9)   Hypertension   Bone lesion   Body mass index is 36.43 kg/m.  (Morbid obesity)   Pathologic right femur fracture: S/p intramedullary nail intertrochanteric right hip on 08/28/2020.  Analgesics as needed for pain.  Follow-up with orthopedic surgeon.  Pathology report is pending.  Hypertension: Continue antihypertensives  Hypokalemia: Improved  Mild hyponatremia: Asymptomatic.  Constipation: She still has not had any bowel movement.  She says sometimes she can go for days without any bowel movement. Continue laxatives.  Remote history of left breast cancer s/p bilateral mastectomy and chemoradiation in 2003: CT abdomen pelvis showed lytic/sclerotic lesions involving the pelvis, raising concern for osseous metastasis.  Tumor markers are elevated (CA 15-3: 76.9, CA 27.29: 95.4). CT chest showed 1 cm nodule in the lateral left lower lobe.  Repeat CT chest recommended in the outpatient setting for follow-up.  Follow-up with oncologist.  Check CBC and  BMP tomorrow    Diet Order            Diet regular Room service appropriate? Yes; Fluid consistency: Thin  Diet effective now                    Consultants:  Orthopedic surgeon  Procedures:  Plan for hip surgery today    Medications:   . Chlorhexidine Gluconate Cloth  6 each Topical Daily  . cholecalciferol  1,000 Units Oral Daily  . enoxaparin (LOVENOX) injection  40 mg Subcutaneous Q24H  . lisinopril  20 mg Oral Daily  . melatonin  10 mg Oral QHS  . polyethylene glycol  17 g Oral Daily  . potassium chloride  40 mEq Oral Once  . senna  1 tablet Oral BID  . simvastatin  40 mg Oral QHS  . vitamin B-12  500 mcg Oral Daily   Continuous Infusions: . sodium chloride    . methocarbamol (ROBAXIN) IV       Anti-infectives (From admission, onward)   Start     Dose/Rate Route Frequency Ordered Stop   08/28/20 2200  ceFAZolin (ANCEF) IVPB 2g/100 mL premix        2 g 200 mL/hr over 30 Minutes Intravenous Every 6 hours 08/28/20 1822 08/29/20 0939   08/28/20 1500  ceFAZolin (ANCEF) IVPB 2g/100 mL premix        2 g 200 mL/hr over 30 Minutes Intravenous  Once 08/27/20 1535 08/28/20 1559             Family  Communication/Anticipated D/C date and plan/Code Status   DVT prophylaxis: Place and maintain sequential compression device Start: 08/31/20 1002 enoxaparin (LOVENOX) injection 40 mg Start: 08/29/20 0800 Place TED hose Start: 08/28/20 1823 SCDs Start: 08/27/20 1510     Code Status: Full Code  Family Communication: None Disposition Plan:    Status is: Inpatient  Remains inpatient appropriate because:Inpatient level of care appropriate due to severity of illness   Dispo: The patient is from: Home              Anticipated d/c is to: SNF              Patient currently is not medically stable to d/c.   Difficult to place patient No           Subjective:   Interval events noted.  No new complaints.  She feels better today.  She has pain in  the right hip wound she moves around but otherwise she feels okay.  Objective:    Vitals:   09/01/20 0014 09/01/20 0458 09/01/20 0747 09/01/20 1131  BP: 113/63 124/71 128/81 128/81  Pulse: 79 77 77 86  Resp: 16 18 16 18   Temp: 98.6 F (37 C) 98 F (36.7 C) 98.9 F (37.2 C) 98.2 F (36.8 C)  TempSrc: Oral Oral Oral Oral  SpO2: 98% 97% 97% 94%  Weight:      Height:       No data found.   Intake/Output Summary (Last 24 hours) at 09/01/2020 1457 Last data filed at 09/01/2020 1039 Gross per 24 hour  Intake 120 ml  Output --  Net 120 ml   Filed Weights   08/27/20 1319 08/28/20 1442  Weight: 99.3 kg 99.3 kg    Exam:   GEN: NAD SKIN: Warm and dry EYES: No pallor or icterus ENT: MMM CV: RRR PULM: CTA B ABD: soft, obese, NT, +BS CNS: AAO x 3, non focal EXT: Right hip tenderness.  Dressing on right hip surgical wound is dry and intact.          Data Reviewed:   I have personally reviewed following labs and imaging studies:  Labs: Labs show the following:   Basic Metabolic Panel: Recent Labs  Lab 08/27/20 1324 08/28/20 0415 08/29/20 0416  NA 134* 135 134*  K 3.2* 3.5 3.8  CL 99 100 102  CO2 21* 24 23  GLUCOSE 113* 112* 137*  BUN 22 15 13   CREATININE 0.76 0.67 0.58  CALCIUM 9.7 9.1 8.6*   GFR Estimated Creatinine Clearance: 70.9 mL/min (by C-G formula based on SCr of 0.58 mg/dL). Liver Function Tests: No results for input(s): AST, ALT, ALKPHOS, BILITOT, PROT, ALBUMIN in the last 168 hours. No results for input(s): LIPASE, AMYLASE in the last 168 hours. No results for input(s): AMMONIA in the last 168 hours. Coagulation profile No results for input(s): INR, PROTIME in the last 168 hours.  CBC: Recent Labs  Lab 08/27/20 1324 08/28/20 0415 08/29/20 0416  WBC 10.3 9.4 10.8*  NEUTROABS 7.4  --   --   HGB 13.6 13.1 12.6  HCT 40.0 38.4 37.2  MCV 89.3 88.9 89.6  PLT 335 291 290   Cardiac Enzymes: No results for input(s): CKTOTAL, CKMB,  CKMBINDEX, TROPONINI in the last 168 hours. BNP (last 3 results) No results for input(s): PROBNP in the last 8760 hours. CBG: No results for input(s): GLUCAP in the last 168 hours. D-Dimer: No results for input(s): DDIMER in the last 72 hours. Hgb A1c:  No results for input(s): HGBA1C in the last 72 hours. Lipid Profile: No results for input(s): CHOL, HDL, LDLCALC, TRIG, CHOLHDL, LDLDIRECT in the last 72 hours. Thyroid function studies: No results for input(s): TSH, T4TOTAL, T3FREE, THYROIDAB in the last 72 hours.  Invalid input(s): FREET3 Anemia work up: No results for input(s): VITAMINB12, FOLATE, FERRITIN, TIBC, IRON, RETICCTPCT in the last 72 hours. Sepsis Labs: Recent Labs  Lab 08/27/20 1324 08/28/20 0415 08/29/20 0416  WBC 10.3 9.4 10.8*    Microbiology Recent Results (from the past 240 hour(s))  Resp Panel by RT-PCR (Flu A&B, Covid) Nasopharyngeal Swab     Status: None   Collection Time: 08/27/20  3:15 PM   Specimen: Nasopharyngeal Swab; Nasopharyngeal(NP) swabs in vial transport medium  Result Value Ref Range Status   SARS Coronavirus 2 by RT PCR NEGATIVE NEGATIVE Final    Comment: (NOTE) SARS-CoV-2 target nucleic acids are NOT DETECTED.  The SARS-CoV-2 RNA is generally detectable in upper respiratory specimens during the acute phase of infection. The lowest concentration of SARS-CoV-2 viral copies this assay can detect is 138 copies/mL. A negative result does not preclude SARS-Cov-2 infection and should not be used as the sole basis for treatment or other patient management decisions. A negative result may occur with  improper specimen collection/handling, submission of specimen other than nasopharyngeal swab, presence of viral mutation(s) within the areas targeted by this assay, and inadequate number of viral copies(<138 copies/mL). A negative result must be combined with clinical observations, patient history, and epidemiological information. The expected  result is Negative.  Fact Sheet for Patients:  EntrepreneurPulse.com.au  Fact Sheet for Healthcare Providers:  IncredibleEmployment.be  This test is no t yet approved or cleared by the [place] FDA and  has been authorized for detection and/or diagnosis of SARS-CoV-2 by FDA under an Emergency Use Authorization (EUA). This EUA will remain  in effect (meaning this test can be used) for the duration of the COVID-19 declaration under Section 564(b)(1) of the Act, 21 U.S.C.section 360bbb-3(b)(1), unless the authorization is terminated  or revoked sooner.       Influenza A by PCR NEGATIVE NEGATIVE Final   Influenza B by PCR NEGATIVE NEGATIVE Final    Comment: (NOTE) The Xpert Xpress SARS-CoV-2/FLU/RSV plus assay is intended as an aid in the diagnosis of influenza from Nasopharyngeal swab specimens and should not be used as a sole basis for treatment. Nasal washings and aspirates are unacceptable for Xpert Xpress SARS-CoV-2/FLU/RSV testing.  Fact Sheet for Patients: EntrepreneurPulse.com.au  Fact Sheet for Healthcare Providers: IncredibleEmployment.be  This test is not yet approved or cleared by the [place] FDA and has been authorized for detection and/or diagnosis of SARS-CoV-2 by FDA under an Emergency Use Authorization (EUA). This EUA will remain in effect (meaning this test can be used) for the duration of the COVID-19 declaration under Section 564(b)(1) of the Act, 21 U.S.C. section 360bbb-3(b)(1), unless the authorization is terminated or revoked.  Performed at [facility], [address], [place], [place] [address]   Surgical pcr screen     Status: None   Collection Time: 08/27/20 11:29 PM   Specimen: Nasal Mucosa; Nasal Swab  Result Value Ref Range Status   MRSA, PCR NEGATIVE NEGATIVE Final   Staphylococcus aureus NEGATIVE NEGATIVE Final    Comment: (NOTE) The Xpert SA Assay  (FDA approved for NASAL specimens in patients 34 years of age and older), is one component of a comprehensive surveillance program. It is not intended to diagnose infection  nor to guide or monitor treatment. Performed at [facility], [address] [place], [place] [address]     Procedures and diagnostic studies:  No results found.             LOS: 5 days   Carol Theys  [facility]   Pager on [email or web address]. If 7PM-7AM, please contact night-coverage at [email or web address]     09/01/2020, 2:57 PM

## 2020-09-01 NOTE — Progress Notes (Addendum)
  Subjective: 4 Days Post-Op Procedure(s) (LRB): INTRAMEDULLARY (IM) NAIL INTERTROCHANTRIC (Right) Patient reports pain as well-controlled.   Patient is feeling better today but still with considerable leg pain. Plan is to go Skilled nursing facility after hospital stay. Negative for chest pain and shortness of breath Fever: no Gastrointestinal: negative for nausea and vomiting.   Patient has not had a bowel movement.  Objective: Vital signs in last 24 hours: Temp:  [98 F (36.7 C)-100 F (37.8 C)] 98.9 F (37.2 C) (05/30 0747) Pulse Rate:  [77-86] 77 (05/30 0747) Resp:  [16-18] 16 (05/30 0747) BP: (113-128)/(63-81) 128/81 (05/30 0747) SpO2:  [95 %-98 %] 97 % (05/30 0747)  Intake/Output from previous day:  Intake/Output Summary (Last 24 hours) at 09/01/2020 1043 Last data filed at 09/01/2020 1039 Gross per 24 hour  Intake 120 ml  Output --  Net 120 ml    Intake/Output this shift: Total I/O In: 120 [P.O.:120] Out: -   Labs: No results for input(s): HGB in the last 72 hours. No results for input(s): WBC, RBC, HCT, PLT in the last 72 hours. No results for input(s): NA, K, CL, CO2, BUN, CREATININE, GLUCOSE, CALCIUM in the last 72 hours. No results for input(s): LABPT, INR in the last 72 hours.   EXAM General - Patient is Alert, Appropriate and Oriented Extremity - Neurovascular intact Dorsiflexion/Plantar flexion intact Compartment soft Dressing/Incision -Postoperative dressing remains in place. Motor Function - intact, moving foot and toes well on exam.     Assessment/Plan: 4 Days Post-Op Procedure(s) (LRB): INTRAMEDULLARY (IM) NAIL INTERTROCHANTRIC (Right) Principal Problem:   Closed displaced subtrochanteric fracture of right femur (HCC) Active Problems:   Breast CA (HCC)   Obesity (BMI 30-39.9)   Hypertension   Bone lesion  Estimated body mass index is 36.43 kg/m as calculated from the following:   Height as of this encounter: 5\' 5"  (1.651 m).   Weight  as of this encounter: 99.3 kg. Advance diet Up with therapy  Work on BM.  Post-op dressing removed. Honeycomb dressings placed over incisions. Minimal drainage noted.  Plan is for d/c to SNF.     DVT Prophylaxis - Lovenox, Ted hose and SCDs Weight-Bearing as tolerated to right leg  Cassell Smiles, PA-C [facility] 09/01/2020, 10:43 AM

## 2020-09-01 NOTE — Progress Notes (Signed)
Physical Therapy Treatment Patient Details Name: Melanie Malone MRN: [id number] DOB: 04-23-1945 Today's Date: 09/01/2020    History of Present Illness Pt is a 75 y.o. female presenting to hospital with R hip pain (progressively worsening last 3 weeks); R leg gave out walking upstairs (did not fall) and unable to walk since.  Imaging showing acute pathologic subtrochanteric fx of proximal R femur through  mixed lytic and sclerotic bone lesion; also additional predeominantly lytic bony lesions throughout visualized portion of pelvis including large lytic lesion within L iliac bone measuring about 7.0 cm.  S/p 5/26 R IMN intertrochantric secondary to R hip fx subtrochanteric pathologic.  PMH includes h/o breast CA s/p double mastectomy, HLD, htn, PONV, arthritis (knees, lower back).    PT Comments    Patient tolerated session better today requiring less assistance. She continues to have increased RLE pain which limits mobility. Patient instructed in advanced LE ROM exercise with increased repetition. She requires min VCS for proper positioning/exercise technique. Patient able to progress bed mobility and transfers to min A +1 but does require increased time and effort. She continues to be limited in gait to short distances (3 feet) often shuffling on RLE due to difficulty advancing RLE forward. She would benefit from additional skilled PT Intervention to improve strength, balance and mobility; Current plan of SNF remains appropriate.     Follow Up Recommendations  SNF     Equipment Recommendations  Rolling walker with 5" wheels;3in1 (PT)    Recommendations for Other Services       Precautions / Restrictions Precautions Precautions: Fall Precaution Comments: No BP L UE Restrictions Weight Bearing Restrictions: Yes RLE Weight Bearing: Weight bearing as tolerated    Mobility  Bed Mobility Overal bed mobility: Needs Assistance Bed Mobility: Supine to Sit     Supine to sit: Min assist;HOB  elevated     General bed mobility comments: Requires increased time but able to do with better positioning; Has difficulty sliding RLE forward on bed;    Transfers Overall transfer level: Needs assistance Equipment used: Rolling walker (2 wheeled) Transfers: Sit to/from Stand Sit to Stand: Min assist         General transfer comment: with +1, elevated bed with cues for forward weight shift and to advance RLE forward for better transfer ability;  Ambulation/Gait Ambulation/Gait assistance: Min assist Gait Distance (Feet): 3 Feet Assistive device: Rolling walker (2 wheeled) Gait Pattern/deviations: Step-to pattern Gait velocity: decreased   General Gait Details: antalgic; short steps; vc's for technique and walker use, often shuffles on RLE having difficulty advancing RLE forward;   Stairs             Wheelchair Mobility    Modified Rankin (Stroke Patients Only)       Balance Overall balance assessment: Needs assistance Sitting-balance support: No upper extremity supported;Feet supported Sitting balance-Leahy Scale: Fair Sitting balance - Comments: steady static sitting   Standing balance support: Bilateral upper extremity supported Standing balance-Leahy Scale: Fair Standing balance comment: pt requiring B UE support for static standing balance                            Cognition Arousal/Alertness: Awake/alert Behavior During Therapy: WFL for tasks assessed/performed Overall Cognitive Status: Within Functional Limits for tasks assessed  Exercises Other Exercises Other Exercises: Instructed patient in supine LE exercise: BLE ankle pump x15 with cues to increase ROM for better strengthening/flexibility; Other Exercises: Also instructed patient in RLE ROM exercise: heel slides x2x10, hip abduction/adduction to midline 2x10, SLR hip flexion x10 all requiring AAROM for better ROM/flexibility;  Patient able to exhibit better ROM with 2nd set of exercise    General Comments        Pertinent Vitals/Pain Pain Assessment: 0-10 Pain Score: 6  Pain Location: right hip/thigh Pain Descriptors / Indicators: Aching;Guarding;Sharp;Shooting Pain Intervention(s): Limited activity within patient's tolerance;Monitored during session;Repositioned    Home Living                      Prior Function            PT Goals (current goals can now be found in the care plan section) Acute Rehab PT Goals Patient Stated Goal: to go home PT Goal Formulation: With patient Time For Goal Achievement: 09/12/20 Potential to Achieve Goals: Fair Progress towards PT goals: Progressing toward goals    Frequency    BID      PT Plan Current plan remains appropriate    Co-evaluation              AM-PAC PT "6 Clicks" Mobility   Outcome Measure  Help needed turning from your back to your side while in a flat bed without using bedrails?: A Lot Help needed moving from lying on your back to sitting on the side of a flat bed without using bedrails?: A Little Help needed moving to and from a bed to a chair (including a wheelchair)?: A Lot Help needed standing up from a chair using your arms (e.g., wheelchair or bedside chair)?: A Lot Help needed to walk in hospital room?: A Lot Help needed climbing 3-5 steps with a railing? : Total 6 Click Score: 12    End of Session Equipment Utilized During Treatment: Gait belt Activity Tolerance: Patient tolerated treatment well;Patient limited by pain Patient left: in chair;with call bell/phone within reach;with chair alarm set Nurse Communication: Mobility status;Precautions;Weight bearing status PT Visit Diagnosis: Other abnormalities of gait and mobility (R26.89);Muscle weakness (generalized) (M62.81);History of falling (Z91.81);Pain Pain - Right/Left: Right Pain - part of body: Hip     Time: 1610-9604 PT Time Calculation (min) (ACUTE  ONLY): 25 min  Charges:  $Gait Training: 8-22 mins $Therapeutic Exercise: 8-22 mins                        Kioni Stahl PT, DPT 09/01/2020, 3:51 PM

## 2020-09-02 ENCOUNTER — Encounter: Payer: Self-pay | Admitting: Orthopedic Surgery

## 2020-09-02 DIAGNOSIS — M899 Disorder of bone, unspecified: Secondary | ICD-10-CM | POA: Diagnosis not present

## 2020-09-02 DIAGNOSIS — S7221XD Displaced subtrochanteric fracture of right femur, subsequent encounter for closed fracture with routine healing: Secondary | ICD-10-CM | POA: Diagnosis not present

## 2020-09-02 LAB — CBC WITH DIFFERENTIAL/PLATELET
Abs Immature Granulocytes: 0.11 10*3/uL — ABNORMAL HIGH (ref 0.00–0.07)
Basophils Absolute: 0.1 10*3/uL (ref 0.0–0.1)
Basophils Relative: 1 %
Eosinophils Absolute: 0.1 10*3/uL (ref 0.0–0.5)
Eosinophils Relative: 1 %
HCT: 32.3 % — ABNORMAL LOW (ref 36.0–46.0)
Hemoglobin: 10.7 g/dL — ABNORMAL LOW (ref 12.0–15.0)
Immature Granulocytes: 1 %
Lymphocytes Relative: 15 %
Lymphs Abs: 1.3 10*3/uL (ref 0.7–4.0)
MCH: 30.3 pg (ref 26.0–34.0)
MCHC: 33.1 g/dL (ref 30.0–36.0)
MCV: 91.5 fL (ref 80.0–100.0)
Monocytes Absolute: 1.5 10*3/uL — ABNORMAL HIGH (ref 0.1–1.0)
Monocytes Relative: 17 %
Neutro Abs: 5.4 10*3/uL (ref 1.7–7.7)
Neutrophils Relative %: 65 %
Platelets: 301 10*3/uL (ref 150–400)
RBC: 3.53 MIL/uL — ABNORMAL LOW (ref 3.87–5.11)
RDW: 13.1 % (ref 11.5–15.5)
WBC: 8.5 10*3/uL (ref 4.0–10.5)
nRBC: 0 % (ref 0.0–0.2)

## 2020-09-02 LAB — BASIC METABOLIC PANEL
Anion gap: 10 (ref 5–15)
BUN: 14 mg/dL (ref 8–23)
CO2: 24 mmol/L (ref 22–32)
Calcium: 8.8 mg/dL — ABNORMAL LOW (ref 8.9–10.3)
Chloride: 101 mmol/L (ref 98–111)
Creatinine, Ser: 0.48 mg/dL (ref 0.44–1.00)
GFR, Estimated: >60 mL/min (ref >60)
Glucose, Bld: 111 mg/dL — ABNORMAL HIGH (ref 70–99)
Potassium: 3.5 mmol/L (ref 3.5–5.1)
Sodium: 135 mmol/L (ref 135–145)

## 2020-09-02 NOTE — TOC Progression Note (Signed)
Transition of Care [facility]) - Progression Note    Patient Details  Name: Melanie Malone MRN: [id number] Date of Birth: 28-Feb-1946  Transition of Care [facility]) CM/SW [place], RN Phone Number: 09/02/2020, 4:21 PM  Clinical Narrative:   Spoke with the patient to review bed offers, she accepted the offer from [facility], I requested for [facility] to start [facility], awaiting approval         Expected Discharge Plan and Services                                                 Social Determinants of Health (SDOH) Interventions    Readmission Risk Interventions No flowsheet data found.

## 2020-09-02 NOTE — TOC Progression Note (Signed)
Transition of Care [facility]) - Progression Note    Patient Details  Name: Melanie Malone MRN: [id number] Date of Birth: 1945-04-16  Transition of Care [facility]) CM/SW Contact  Su Hilt, RN Phone Number: 09/02/2020, 3:00 PM  Clinical Narrative:   Magda Paganini with [facility] contacted me to let me know they will not have a bed, I sent the referral to surrounding areas looking for a STR bed, Will review with the patient once obtained         Expected Discharge Plan and Services                                                 Social Determinants of Health (SDOH) Interventions    Readmission Risk Interventions No flowsheet data found.

## 2020-09-02 NOTE — Progress Notes (Signed)
Physical Therapy Treatment Patient Details Name: Melanie Malone MRN: [id number] DOB: Dec 25, 1945 Today's Date: 09/02/2020    History of Present Illness Pt is a 75 y.o. female presenting to hospital with R hip pain (progressively worsening last 3 weeks); R leg gave out walking upstairs (did not fall) and unable to walk since.  Imaging showing acute pathologic subtrochanteric fx of proximal R femur through  mixed lytic and sclerotic bone lesion; also additional predeominantly lytic bony lesions throughout visualized portion of pelvis including large lytic lesion within L iliac bone measuring about 7.0 cm.  S/p 5/26 R IMN intertrochantric secondary to R hip fx subtrochanteric pathologic.  PMH includes h/o breast CA s/p double mastectomy, HLD, htn, PONV, arthritis (knees, lower back).    PT Comments    Pt continues to have pain with any and all R LE acts (also complaining of some L thigh pain).  She showed great effort but continues to need plenty of cuing, encouragement and rest breaks between reps.  She managed to do some walking and showed good effort but ultimately she is limited in how much she can tolerate both due to pain as well as fatigue with upright acts.     Follow Up Recommendations  SNF     Equipment Recommendations  Rolling walker with 5" wheels;3in1 (PT)    Recommendations for Other Services       Precautions / Restrictions Precautions Precautions: Fall Precaution Comments: No BP L UE Restrictions RLE Weight Bearing: Weight bearing as tolerated    Mobility  Bed Mobility Overal bed mobility: Needs Assistance Bed Mobility: Sit to Supine       Sit to supine: Min assist   General bed mobility comments: very slow and labored with heavy UE use of rails.  She was determinted to do as much as she could on her own but ultimately she did need assist to lift R LE into bed and scoot hips    Transfers Overall transfer level: Needs assistance Equipment used: Rolling walker (2  wheeled) Transfers: Sit to/from Stand Sit to Stand: Min assist;From elevated surface            Ambulation/Gait Ambulation/Gait assistance: Min assist;Min guard Gait Distance (Feet): 4 Feet Assistive device: Rolling walker (2 wheeled)       General Gait Details: Pt continues to struggle with R foot clearance as well as weight acceptance.  Despite good effort to more substantially clear feet/take steps she generally did the heel-toe shuffle and was very reliant on her quick to fatigue UEs   Stairs             Wheelchair Mobility    Modified Rankin (Stroke Patients Only)       Balance     Sitting balance-Leahy Scale: Good       Standing balance-Leahy Scale: Fair                              Cognition Arousal/Alertness: Awake/alert Behavior During Therapy: Anxious Overall Cognitive Status: Within Functional Limits for tasks assessed                                        Exercises General Exercises - Lower Extremity Ankle Circles/Pumps: AROM;10 reps Quad Sets: 15 reps;Strengthening Short Arc Quad: AROM;AAROM;10 reps Heel Slides: AROM;10 reps (very lightly resisted leg extensions) Hip ABduction/ADduction: AAROM;AROM;10 reps  General Comments        Pertinent Vitals/Pain Pain Score: 8     Home Living                      Prior Function            PT Goals (current goals can now be found in the care plan section) Progress towards PT goals: Progressing toward goals    Frequency    BID      PT Plan Current plan remains appropriate    Co-evaluation              AM-PAC PT "6 Clicks" Mobility   Outcome Measure  Help needed turning from your back to your side while in a flat bed without using bedrails?: A Lot Help needed moving from lying on your back to sitting on the side of a flat bed without using bedrails?: A Little Help needed moving to and from a bed to a chair (including a  wheelchair)?: A Lot Help needed standing up from a chair using your arms (e.g., wheelchair or bedside chair)?: A Lot Help needed to walk in hospital room?: A Lot Help needed climbing 3-5 steps with a railing? : Total 6 Click Score: 12    End of Session Equipment Utilized During Treatment: Gait belt Activity Tolerance: Patient limited by pain Patient left: in chair;with call bell/phone within reach;with chair alarm set Nurse Communication: Mobility status;Precautions;Weight bearing status PT Visit Diagnosis: Other abnormalities of gait and mobility (R26.89);Muscle weakness (generalized) (M62.81);History of falling (Z91.81);Pain Pain - Right/Left: Right Pain - part of body: Hip     Time: 8916-9450 PT Time Calculation (min) (ACUTE ONLY): 28 min  Charges:  $Gait Training: 8-22 mins $Therapeutic Exercise: 8-22 mins                     Kreg Shropshire, DPT 09/02/2020, 4:51 PM

## 2020-09-02 NOTE — Progress Notes (Signed)
Physical Therapy Treatment Patient Details Name: Melanie Malone MRN: [id number] DOB: 04/13/1945 Today's Date: 09/02/2020    History of Present Illness Pt is a 75 y.o. female presenting to hospital with R hip pain (progressively worsening last 3 weeks); R leg gave out walking upstairs (did not fall) and unable to walk since.  Imaging showing acute pathologic subtrochanteric fx of proximal R femur through  mixed lytic and sclerotic bone lesion; also additional predeominantly lytic bony lesions throughout visualized portion of pelvis including large lytic lesion within L iliac bone measuring about 7.0 cm.  S/p 5/26 R IMN intertrochantric secondary to R hip fx subtrochanteric pathologic.  PMH includes h/o breast CA s/p double mastectomy, HLD, htn, PONV, arthritis (knees, lower back).    PT Comments    Pt with hypersensitivity to any R hip movement (active or passive) initially, she was able to calm this some and did show increased effort and AROM with increased reps but ultimately needed extra time, rest, cuing and encouragement to perform most acts.  Pt did relatively well but was very slow and guarded with bed mobility, and though she was able to "ambulate" further than any prior attempt since surgery and needed Herculean effort to go 5-6 ft.  Pt pleasant and motivated but with pain and fear of pain limiting ability to progress as expected post sx; hoping to go to STR today.     Follow Up Recommendations  SNF     Equipment Recommendations  Rolling walker with 5" wheels;3in1 (PT)    Recommendations for Other Services       Precautions / Restrictions Precautions Precautions: Fall Precaution Comments: No BP L UE Restrictions Weight Bearing Restrictions: Yes RLE Weight Bearing: Weight bearing as tolerated    Mobility  Bed Mobility Overal bed mobility: Needs Assistance Bed Mobility: Supine to Sit     Supine to sit: Min assist     General bed mobility comments: very slow and labored with  heavy UE use of rails.  She was determinted to do as much as she could on her own but ultimately she did need assist to shift hips and get R LE off EOB    Transfers Overall transfer level: Needs assistance Equipment used: Rolling walker (2 wheeled) Transfers: Sit to/from Stand Sit to Stand: Min assist;From elevated surface         General transfer comment: Pt again motivated to do as much as she could on her own.  She did initiate upward movement but needed light assist to sustain and get to standing.  Good but labored and limited effort.  Ambulation/Gait Ambulation/Gait assistance: Min assist Gait Distance (Feet): 6 Feet Assistive device: Rolling walker (2 wheeled)       General Gait Details: Pt with extremely guarded and hesitant gait.  She struggled to lift/clear R foot and was very quick to fatigue 2/2 UE use and c/o pain during R stance phase.  She was able to push herself more than any prior walk since surgery but even with heavy cuing/encouragement and excessive time she was only able to manage 5-6 ft before fatigue and pain necessitated stopping ambulation.   Stairs             Wheelchair Mobility    Modified Rankin (Stroke Patients Only)       Balance Overall balance assessment: Needs assistance Sitting-balance support: No upper extremity supported;Feet supported Sitting balance-Leahy Scale: Fair     Standing balance support: Bilateral upper extremity supported Standing balance-Leahy Scale: [place] Standing  balance comment: Pt was highly reliant on the walker/UEs, she did not have any buckling or stagger steps but never showed good confidence and was quick to fatigue and request/need to sit.                            Cognition Arousal/Alertness: Awake/alert Behavior During Therapy: Anxious Overall Cognitive Status: Within Functional Limits for tasks assessed                                        Exercises General Exercises -  Lower Extremity Ankle Circles/Pumps: AROM;10 reps Quad Sets: Strengthening;15 reps Short Arc Quad: AAROM;10 reps Heel Slides: AAROM;10 reps;AROM Hip ABduction/ADduction: AAROM;AROM;10 reps Straight Leg Raises: AAROM;5 reps;PROM    General Comments        Pertinent Vitals/Pain Pain Assessment: 0-10 Pain Score: 7  Pain Location: right hip/thigh    Home Living                      Prior Function            PT Goals (current goals can now be found in the care plan section) Progress towards PT goals: Progressing toward goals    Frequency    BID      PT Plan Current plan remains appropriate    Co-evaluation              AM-PAC PT "6 Clicks" Mobility   Outcome Measure  Help needed turning from your back to your side while in a flat bed without using bedrails?: A Lot Help needed moving from lying on your back to sitting on the side of a flat bed without using bedrails?: A Little Help needed moving to and from a bed to a chair (including a wheelchair)?: A Lot Help needed standing up from a chair using your arms (e.g., wheelchair or bedside chair)?: A Lot Help needed to walk in hospital room?: A Lot Help needed climbing 3-5 steps with a railing? : Total 6 Click Score: 12    End of Session Equipment Utilized During Treatment: Gait belt Activity Tolerance: Patient limited by pain Patient left: in chair;with call bell/phone within reach;with chair alarm set Nurse Communication: Mobility status;Precautions;Weight bearing status PT Visit Diagnosis: Other abnormalities of gait and mobility (R26.89);Muscle weakness (generalized) (M62.81);History of falling (Z91.81);Pain Pain - Right/Left: Right Pain - part of body: Hip     Time: 2263-3354 PT Time Calculation (min) (ACUTE ONLY): 41 min  Charges:  $Gait Training: 8-22 mins $Therapeutic Exercise: 8-22 mins $Therapeutic Activity: 8-22 mins                     Kreg Shropshire, DPT 09/02/2020, 11:17  AM

## 2020-09-02 NOTE — Progress Notes (Signed)
Progress Note    Melanie Malone  WUJ:[id number] DOB: [birth year]  DOA: 08/27/2020 PCP: Dion Body, MD      Brief Narrative:    Medical records reviewed and are as summarized below:  Melanie Malone is a 75 y.o. female several years ago, morbid obesity, who presented to the hospital because of severe right hip pain.  She said her right leg giving way while walking up the stairs and she could no longer ambulate.  She did not fall and was able to grab onto the rail and called out for help.  However, she mentioned that she fell about 3 months prior to admission and have been experiencing pain in the right hip for some time although she was able to ambulate at that time.  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    Assessment/Plan:   Principal Problem:   Closed displaced subtrochanteric fracture of right femur (HCC) Active Problems:   Breast CA (HCC)   Obesity (BMI 30-39.9)   Hypertension   Bone lesion   Body mass index is 36.43 kg/m.  (Morbid obesity)   Pathologic right femur fracture: S/p intramedullary nail intertrochanteric right hip on 08/28/2020.  Analgesics as needed for pain.  Pathology report is pending.  Follow-up with orthopedic surgeon.  Hypertension: Continue antihypertensives  Hypokalemia: Improved  Mild hyponatremia: Asymptomatic.  Constipation: She still has not had any bowel movement.  She says sometimes she can go for days without any bowel movement. Continue laxatives.  Remote history of left breast cancer s/p bilateral mastectomy and chemoradiation in 2003: CT abdomen pelvis showed lytic/sclerotic lesions involving the pelvis, raising concern for osseous metastasis.  Tumor markers are elevated (CA 15-3: 76.9, CA 27.29: 95.4). CT chest showed 1 cm nodule in the lateral left lower lobe.  Repeat CT chest recommended in the outpatient setting for follow-up.  Follow-up with oncologist.  Stage II  coccygeal decubitus ulcer (present on admission): Encouraged patient to turn from side to side.  Continue local wound care.    Diet Order            Diet regular Room service appropriate? Yes; Fluid consistency: Thin  Diet effective now                    Consultants:  Orthopedic surgeon  Procedures:  Plan for hip surgery today    Medications:   . Chlorhexidine Gluconate Cloth  6 each Topical Daily  . cholecalciferol  1,000 Units Oral Daily  . enoxaparin (LOVENOX) injection  40 mg Subcutaneous Q24H  . lisinopril  20 mg Oral Daily  . melatonin  10 mg Oral QHS  . potassium chloride  40 mEq Oral Once  . simvastatin  40 mg Oral QHS  . vitamin B-12  500 mcg Oral Daily   Continuous Infusions: . sodium chloride    . methocarbamol (ROBAXIN) IV       Anti-infectives (From admission, onward)   Start     Dose/Rate Route Frequency Ordered Stop   08/28/20 2200  ceFAZolin (ANCEF) IVPB 2g/100 mL premix        2 g 200 mL/hr over 30 Minutes Intravenous Every 6 hours 08/28/20 1822 08/29/20 0939   08/28/20 1500  ceFAZolin (ANCEF) IVPB 2g/100 mL premix        2 g 200 mL/hr over 30 Minutes Intravenous  Once 08/27/20 1535 08/28/20 1559             Family Communication/Anticipated  D/C date and plan/Code Status   DVT prophylaxis: Place and maintain sequential compression device Start: 08/31/20 1002 enoxaparin (LOVENOX) injection 40 mg Start: 08/29/20 0800 Place TED hose Start: 08/28/20 1823 SCDs Start: 08/27/20 1510     Code Status: Full Code  Family Communication: None Disposition Plan:    Status is: Inpatient  Remains inpatient appropriate because:Inpatient level of care appropriate due to severity of illness   Dispo: The patient is from: Home              Anticipated d/c is to: SNF              Patient currently is not medically stable to d/c.   Difficult to place patient No           Subjective:   Interval events noted.  Pain in the right  right hip is better.  Objective:    Vitals:   09/02/20 0326 09/02/20 0747 09/02/20 1151 09/02/20 1546  BP: 127/75 126/80 117/71 126/70  Pulse: 73 72 80 80  Resp: 18 14 18 16   Temp: (!) 97.5 F (36.4 C) 98.2 F (36.8 C) 98 F (36.7 C) 97.9 F (36.6 C)  TempSrc: Oral Oral Oral Oral  SpO2: 97% 98% 98% 97%  Weight:      Height:       No data found.   Intake/Output Summary (Last 24 hours) at 09/02/2020 1836 Last data filed at 09/02/2020 1012 Gross per 24 hour  Intake 240 ml  Output --  Net 240 ml   Filed Weights   08/27/20 1319 08/28/20 1442  Weight: 99.3 kg 99.3 kg    Exam:   GEN: NAD SKIN: Warm and dry.  Stage II decubitus ulcer on the coccygeal area (present on admission) EYES: No pallor or icterus ENT: MMM CV: RRR PULM: CTA B ABD: soft, obese, NT, +BS CNS: AAO x 3, non focal EXT: No edema or tenderness     Data Reviewed:   I have personally reviewed following labs and imaging studies:  Labs: Labs show the following:   Basic Metabolic Panel: Recent Labs  Lab 08/27/20 1324 08/28/20 0415 08/29/20 0416 09/02/20 0507  NA 134* 135 134* 135  K 3.2* 3.5 3.8 3.5  CL 99 100 102 101  CO2 21* 24 23 24   GLUCOSE 113* 112* 137* 111*  BUN 22 15 13 14   CREATININE 0.76 0.67 0.58 0.48  CALCIUM 9.7 9.1 8.6* 8.8*   GFR Estimated Creatinine Clearance: 70.9 mL/min (by C-G formula based on SCr of 0.48 mg/dL). Liver Function Tests: No results for input(s): AST, ALT, ALKPHOS, BILITOT, PROT, ALBUMIN in the last 168 hours. No results for input(s): LIPASE, AMYLASE in the last 168 hours. No results for input(s): AMMONIA in the last 168 hours. Coagulation profile No results for input(s): INR, PROTIME in the last 168 hours.  CBC: Recent Labs  Lab 08/27/20 1324 08/28/20 0415 08/29/20 0416 09/02/20 0507  WBC 10.3 9.4 10.8* 8.5  NEUTROABS 7.4  --   --  5.4  HGB 13.6 13.1 12.6 10.7*  HCT 40.0 38.4 37.2 32.3*  MCV 89.3 88.9 89.6 91.5  PLT 335 291 290 301    Cardiac Enzymes: No results for input(s): CKTOTAL, CKMB, CKMBINDEX, TROPONINI in the last 168 hours. BNP (last 3 results) No results for input(s): PROBNP in the last 8760 hours. CBG: No results for input(s): GLUCAP in the last 168 hours. D-Dimer: No results for input(s): DDIMER in the last 72 hours. Hgb A1c: No results  for input(s): HGBA1C in the last 72 hours. Lipid Profile: No results for input(s): CHOL, HDL, LDLCALC, TRIG, CHOLHDL, LDLDIRECT in the last 72 hours. Thyroid function studies: No results for input(s): TSH, T4TOTAL, T3FREE, THYROIDAB in the last 72 hours.  Invalid input(s): FREET3 Anemia work up: No results for input(s): VITAMINB12, FOLATE, FERRITIN, TIBC, IRON, RETICCTPCT in the last 72 hours. Sepsis Labs: Recent Labs  Lab 08/27/20 1324 08/28/20 0415 08/29/20 0416 09/02/20 0507  WBC 10.3 9.4 10.8* 8.5    Microbiology Recent Results (from the past 240 hour(s))  Resp Panel by RT-PCR (Flu A&B, Covid) Nasopharyngeal Swab     Status: None   Collection Time: 08/27/20  3:15 PM   Specimen: Nasopharyngeal Swab; Nasopharyngeal(NP) swabs in vial transport medium  Result Value Ref Range Status   SARS Coronavirus 2 by RT PCR NEGATIVE NEGATIVE Final    Comment: (NOTE) SARS-CoV-2 target nucleic acids are NOT DETECTED.  The SARS-CoV-2 RNA is generally detectable in upper respiratory specimens during the acute phase of infection. The lowest concentration of SARS-CoV-2 viral copies this assay can detect is 138 copies/mL. A negative result does not preclude SARS-Cov-2 infection and should not be used as the sole basis for treatment or other patient management decisions. A negative result may occur with  improper specimen collection/handling, submission of specimen other than nasopharyngeal swab, presence of viral mutation(s) within the areas targeted by this assay, and inadequate number of viral copies(<138 copies/mL). A negative result must be combined with clinical  observations, patient history, and epidemiological information. The expected result is Negative.  Fact Sheet for Patients:  EntrepreneurPulse.com.au  Fact Sheet for Healthcare Providers:  IncredibleEmployment.be  This test is no t yet approved or cleared by the [place] FDA and  has been authorized for detection and/or diagnosis of SARS-CoV-2 by FDA under an Emergency Use Authorization (EUA). This EUA will remain  in effect (meaning this test can be used) for the duration of the COVID-19 declaration under Section 564(b)(1) of the Act, 21 U.S.C.section 360bbb-3(b)(1), unless the authorization is terminated  or revoked sooner.       Influenza A by PCR NEGATIVE NEGATIVE Final   Influenza B by PCR NEGATIVE NEGATIVE Final    Comment: (NOTE) The Xpert Xpress SARS-CoV-2/FLU/RSV plus assay is intended as an aid in the diagnosis of influenza from Nasopharyngeal swab specimens and should not be used as a sole basis for treatment. Nasal washings and aspirates are unacceptable for Xpert Xpress SARS-CoV-2/FLU/RSV testing.  Fact Sheet for Patients: EntrepreneurPulse.com.au  Fact Sheet for Healthcare Providers: IncredibleEmployment.be  This test is not yet approved or cleared by the [place] FDA and has been authorized for detection and/or diagnosis of SARS-CoV-2 by FDA under an Emergency Use Authorization (EUA). This EUA will remain in effect (meaning this test can be used) for the duration of the COVID-19 declaration under Section 564(b)(1) of the Act, 21 U.S.C. section 360bbb-3(b)(1), unless the authorization is terminated or revoked.  Performed at [facility], [address], [place], [place] [address]   Surgical pcr screen     Status: None   Collection Time: 08/27/20 11:29 PM   Specimen: Nasal Mucosa; Nasal Swab  Result Value Ref Range Status   MRSA, PCR NEGATIVE NEGATIVE Final    Staphylococcus aureus NEGATIVE NEGATIVE Final    Comment: (NOTE) The Xpert SA Assay (FDA approved for NASAL specimens in patients 86 years of age and older), is one component of a comprehensive surveillance program. It is not intended to diagnose  infection nor to guide or monitor treatment. Performed at [facility], [address] [place], [place] [address]     Procedures and diagnostic studies:  No results found.             LOS: 6 days   Leydi Winstead  [facility]   Pager on [email or web address]. If 7PM-7AM, please contact night-coverage at [email or web address]     09/02/2020, 6:36 PM

## 2020-09-02 NOTE — Progress Notes (Signed)
   09/02/20 0910  Clinical Encounter Type  Visited With Patient  Visit Type Initial;Spiritual support  Referral From Chaplain  Consult/Referral To [place] visited room 1A-158A, Pt [name]. Pt is a 75 y.o. female several years ago, morbid obesity, who presented to the hospital because of severe right hip pain.  She said her right leg giving way while walking up the stairs and she could no longer ambulate.  She did not fall and was able to grab onto the rail and called out for help.  However, she mentioned that she fell about 3 months prior to admission and have been experiencing pain in the right hip for some time although she was able to ambulate at that time.Pt stated, she feels the fracture stemmed from her having cancer years ago. I provided reflective listening, emotional and spiritual support and I will FU with Pt this week while rounding.

## 2020-09-02 NOTE — Progress Notes (Signed)
Melanie Malone   DOB:1945/10/08   WC#:[id number]    Subjective: Patient complains of mild pain at the site of surgery/right hip.  She continues to complain of difficulty ambulation; and she was able to get out of her chair yesterday with a walker.  Objective:  Vitals:   09/02/20 1546 09/02/20 2046  BP: 126/70 121/66  Pulse: 80 78  Resp: 16 18  Temp: 97.9 F (36.6 C) 98.4 F (36.9 C)  SpO2: 97% 94%     Intake/Output Summary (Last 24 hours) at 09/02/2020 2123 Last data filed at 09/02/2020 1012 Gross per 24 hour  Intake 240 ml  Output --  Net 240 ml    Physical Exam Constitutional:      Comments: Patient resting in bed comfortably.  Alone.  HENT:     Head: Normocephalic and atraumatic.     Mouth/Throat:     Pharynx: No oropharyngeal exudate.  Eyes:     Pupils: Pupils are equal, round, and reactive to light.  Cardiovascular:     Rate and Rhythm: Normal rate and regular rhythm.  Pulmonary:     Effort: Pulmonary effort is normal. No respiratory distress.     Breath sounds: Normal breath sounds. No wheezing.  Abdominal:     General: Bowel sounds are normal. There is no distension.     Palpations: Abdomen is soft. There is no mass.     Tenderness: There is no abdominal tenderness. There is no guarding or rebound.  Musculoskeletal:        General: No tenderness. Normal range of motion.     Cervical back: Normal range of motion and neck supple.  Skin:    General: Skin is warm.  Neurological:     Mental Status: She is alert and oriented to person, place, and time.  Psychiatric:        Mood and Affect: Affect normal.      Labs:  Lab Results  Component Value Date   WBC 8.5 09/02/2020   HGB 10.7 (L) 09/02/2020   HCT 32.3 (L) 09/02/2020   MCV 91.5 09/02/2020   PLT 301 09/02/2020   NEUTROABS 5.4 09/02/2020    Lab Results  Component Value Date   NA 135 09/02/2020   K 3.5 09/02/2020   CL 101 09/02/2020   CO2 24 09/02/2020    Studies:  No results found.  Bone  lesion # 75 year old female patient with remote history of breast cancer currently admitted hospital for right femoral fracture/suspected pathologic  # Multiple bone lesions noted on CT of the right hip/pathology fracture right femur neck-highly concerning for malignancy/especially given history of breast cancer.  S/p ORIF; bone biopsy.  Breast tumor markers elevated; suggestive of recurrent breast cancer.  However pathology still pending.  CT scan abdomen pelvis no evidence of any visceral disease.  We will plan outpatient radiation after healing of the surgical incision.  Patient will also need to be on Zometa; also start AI plus CDK inhibitor.  Cammie Sickle, MD 09/02/2020  9:23 PM

## 2020-09-02 NOTE — Progress Notes (Signed)
Pt had small bowel movement, watery with smear stools.

## 2020-09-02 NOTE — Progress Notes (Signed)
  Subjective: 5 Days Post-Op Procedure(s) (LRB): INTRAMEDULLARY (IM) NAIL INTERTROCHANTRIC (Right) Patient reports pain as well-controlled.   No complaints Plan is to go Skilled nursing facility after hospital stay. Negative for chest pain and shortness of breath Fever: no Gastrointestinal: negative for nausea and vomiting.     Objective: Vital signs in last 24 hours: Temp:  [97.5 F (36.4 C)-99.2 F (37.3 C)] 98 F (36.7 C) (05/31 1151) Pulse Rate:  [72-84] 80 (05/31 1151) Resp:  [14-18] 18 (05/31 1151) BP: (111-132)/(69-80) 117/71 (05/31 1151) SpO2:  [95 %-100 %] 98 % (05/31 1151)  Intake/Output from previous day:  Intake/Output Summary (Last 24 hours) at 09/02/2020 1215 Last data filed at 09/02/2020 1012 Gross per 24 hour  Intake 240 ml  Output --  Net 240 ml    Intake/Output this shift: Total I/O In: 240 [P.O.:240] Out: -   Labs: Recent Labs    09/02/20 0507  HGB 10.7*   Recent Labs    09/02/20 0507  WBC 8.5  RBC 3.53*  HCT 32.3*  PLT 301   Recent Labs    09/02/20 0507  NA 135  K 3.5  CL 101  CO2 24  BUN 14  CREATININE 0.48  GLUCOSE 111*  CALCIUM 8.8*   No results for input(s): LABPT, INR in the last 72 hours.   EXAM General - Patient is Alert, Appropriate and Oriented Extremity - Neurovascular intact Dorsiflexion/Plantar flexion intact Compartment soft Dressing/Incision - dressing CDI Motor Function - intact, moving foot and toes well on exam.     Assessment/Plan: 5 Days Post-Op Procedure(s) (LRB): INTRAMEDULLARY (IM) NAIL INTERTROCHANTRIC (Right) Principal Problem:   Closed displaced subtrochanteric fracture of right femur (HCC) Active Problems:   Breast CA (HCC)   Obesity (BMI 30-39.9)   Hypertension   Bone lesion  Estimated body mass index is 36.43 kg/m as calculated from the following:   Height as of this encounter: 5\' 5"  (1.651 m).   Weight as of this encounter: 99.3 kg. Advance diet Up with therapy  Work on  BM.  Post-op dressing removed. Honeycomb dressings placed over incisions. Minimal drainage noted.  Plan is for d/c to SNF.  Follow up with [place] ortho in 2 weeks TED hose BLE x 6 weeks Lovenox BLE x 2 weeks     DVT Prophylaxis - Lovenox, Ted hose and SCDs Weight-Bearing as tolerated to right leg  Duanne Guess, PA-C [facility] 09/02/2020, 12:15 PM

## 2020-09-02 NOTE — Plan of Care (Signed)
  Problem: Education: Goal: Knowledge of General Education information will improve Description Including pain rating scale, medication(s)/side effects and non-pharmacologic comfort measures Outcome: Progressing   

## 2020-09-03 ENCOUNTER — Other Ambulatory Visit: Payer: Self-pay | Admitting: Internal Medicine

## 2020-09-03 ENCOUNTER — Telehealth: Payer: Self-pay | Admitting: Internal Medicine

## 2020-09-03 DIAGNOSIS — I10 Essential (primary) hypertension: Secondary | ICD-10-CM | POA: Diagnosis not present

## 2020-09-03 DIAGNOSIS — M899 Disorder of bone, unspecified: Secondary | ICD-10-CM | POA: Diagnosis not present

## 2020-09-03 DIAGNOSIS — C50919 Malignant neoplasm of unspecified site of unspecified female breast: Secondary | ICD-10-CM | POA: Diagnosis not present

## 2020-09-03 DIAGNOSIS — S7221XD Displaced subtrochanteric fracture of right femur, subsequent encounter for closed fracture with routine healing: Secondary | ICD-10-CM | POA: Diagnosis not present

## 2020-09-03 DIAGNOSIS — L8990 Pressure ulcer of unspecified site, unspecified stage: Secondary | ICD-10-CM | POA: Insufficient documentation

## 2020-09-03 LAB — RESP PANEL BY RT-PCR (FLU A&B, COVID) ARPGX2
Influenza A by PCR: NEGATIVE
Influenza B by PCR: NEGATIVE
SARS Coronavirus 2 by RT PCR: NEGATIVE

## 2020-09-03 NOTE — Progress Notes (Signed)
Pt discharged to [facility] via EMS. All instructions provided to pt and transport; report attempted with no success.   BP 118/76 (BP Location: Right Arm)   Pulse 88   Temp 98.4 F (36.9 C) (Oral)   Resp 16   Ht 5\' 5"  (1.651 m)   Wt 99.3 kg   SpO2 97%   BMI 36.43 kg/m

## 2020-09-03 NOTE — Progress Notes (Signed)
Attempted to call report again with no answer.

## 2020-09-03 NOTE — Discharge Summary (Signed)
[facility] NAME: Melanie Malone    MR#:  [id number]  DATE OF BIRTH:  [birth year]  DATE OF ADMISSION:  08/27/2020 ADMITTING PHYSICIAN: Collier Bullock, MD  DATE OF DISCHARGE: 09/03/2020  PRIMARY CARE PHYSICIAN: Dion Body, MD    ADMISSION DIAGNOSIS:  Closed displaced subtrochanteric fracture of right femur ([place]) [S72.21XA]  DISCHARGE DIAGNOSIS:  closed displaced sub trochanteric fracture of right femur/suspected pathology fracture  SECONDARY DIAGNOSIS:   Past Medical History:  Diagnosis Date  . Arthritis    knees, lower back  . Breast CA ([place])   . Hyperlipidemia   . Hypertension   . PONV (postoperative nausea and vomiting)     HOSPITAL COURSE:  Melanie Malone is a 75 y.o. female several years ago, morbid obesity, who presented to the hospital because of severe right hip pain.  She said her right leg giving way while walking up the stairs and she could no longer ambulate.  She did not fall and was able to grab onto the rail and called out for help.  However, she mentioned that she fell about 3 months prior to admission and have been experiencing pain in the right hip for some time although she was able to ambulate at that time.  Pathologic right femur fracture: S/p intramedullary nail intertrochanteric right hip on 08/28/2020.  Analgesics as needed for pain.   --Pathology report is pending at d/c --  Follow-up with orthopedic surgeon. -- Lovenox for DVT prophylaxis for two weeks  Hypertension: Continue antihypertensives  Hypokalemia: Improved  Mild hyponatremia: Asymptomatic.  Remote history of left breast cancer s/p bilateral mastectomy and chemoradiation in 2003:  --CT abdomen pelvis showed lytic/sclerotic lesions involving the pelvis, raising concern for osseous metastasis.  Tumor markers are elevated (CA 15-3: 76.9, CA 27.29: 95.4).  --CT chest showed 1 cm nodule in the lateral left lower lobe.  Repeat CT  chest recommended in the outpatient setting for follow-up.  Follow-up with oncologist Dr. Rogue Bussing.  Stage II coccygeal decubitus ulcer (present on admission): Encouraged patient to turn from side to side.  Continue local wound care.  Overall at baseline. [facility] will [facility]. Should follow-up with orthopedic and Dr. Rogue Bussing as outpatient  CONSULTS OBTAINED:  Treatment Team:  Hessie Knows, MD  DRUG ALLERGIES:   Allergies  Allergen Reactions  . Codeine Shortness Of Breath, Anaphylaxis and Swelling    "Felt like I was having a heart attack"  . Scallops [Shellfish Allergy] Nausea And Vomiting  . Vicodin Hp [Hydrocodone-Acetaminophen] Nausea And Vomiting  . Red Dye Rash    DISCHARGE MEDICATIONS:   Allergies as of 09/03/2020      Reactions   Codeine Shortness Of Breath, Anaphylaxis, Swelling   "Felt like I was having a heart attack"   Scallops [shellfish Allergy] Nausea And Vomiting   Vicodin Hp [hydrocodone-acetaminophen] Nausea And Vomiting   Red Dye Rash      Medication List    STOP taking these medications   Melatonin 10 MG Tabs     TAKE these medications   acetaminophen 500 MG tablet Commonly known as: TYLENOL Take 500 mg by mouth 2 (two) times daily as needed.   enoxaparin 40 MG/0.4ML injection Commonly known as: LOVENOX Inject 0.4 mLs (40 mg total) into the skin daily for 14 days.   GLUCOSAMINE-CHONDROITIN PO Take by mouth daily. [employer] Tamaflex   hydrochlorothiazide 25 MG tablet Commonly known as: HYDRODIURIL Take 25 mg by mouth daily.  ibuprofen 200 MG tablet Commonly known as: ADVIL Take 600 mg by mouth every 6 (six) hours as needed for moderate pain.   lisinopril 20 MG tablet Commonly known as: ZESTRIL Take 20 mg by mouth daily.   simvastatin 40 MG tablet Commonly known as: ZOCOR Take 40 mg by mouth daily.   traMADol 50 MG tablet Commonly known as: ULTRAM Take 1 tablet (50 mg total) by mouth every 6 (six)  hours as needed for moderate pain.   vitamin B-12 500 MCG tablet Commonly known as: CYANOCOBALAMIN Take 500 mcg by mouth daily.   Vitamin D3 25 MCG tablet Commonly known as: Vitamin D Take 1,000 Units by mouth daily.            Discharge Care Instructions  (From admission, onward)         Start     Ordered   09/03/20 0000  Discharge wound care:       Comments: Per orthopedic reinforce dressing until discontinued   09/03/20 1046          If you experience worsening of your admission symptoms, develop shortness of breath, life threatening emergency, suicidal or homicidal thoughts you must seek medical attention immediately by calling 911 or calling your MD immediately  if symptoms less severe.  You Must read complete instructions/literature along with all the possible adverse reactions/side effects for all the Medicines you take and that have been prescribed to you. Take any new Medicines after you have completely understood and accept all the possible adverse reactions/side effects.   Please note  You were cared for by a hospitalist during your hospital stay. If you have any questions about your discharge medications or the care you received while you were in the hospital after you are discharged, you can call the unit and asked to speak with the hospitalist on call if the hospitalist that took care of you is not available. Once you are discharged, your primary care physician will handle any further medical issues. Please note that NO REFILLS for any discharge medications will be authorized once you are discharged, as it is imperative that you return to your primary care physician (or establish a relationship with a primary care physician if you do not have one) for your aftercare needs so that they can reassess your need for medications and monitor your lab values. Today   SUBJECTIVE  overall doing well. Had a bowel movement.   VITAL SIGNS:  Blood pressure 139/75, pulse 76,  temperature 98.4 F (36.9 C), resp. rate 16, height 5\' 5"  (1.651 m), weight 99.3 kg, SpO2 97 %.  I/O:    Intake/Output Summary (Last 24 hours) at 09/03/2020 1047 Last data filed at 09/03/2020 1018 Gross per 24 hour  Intake 240 ml  Output --  Net 240 ml    PHYSICAL EXAMINATION:  GENERAL:  75 y.o.-year-old patient lying in the bed with no acute distress.  LUNGS: Normal breath sounds bilaterally, no wheezing, rales,rhonchi or crepitation. No use of accessory muscles of respiration.  CARDIOVASCULAR: S1, S2 normal. No murmurs, rubs, or gallops.  ABDOMEN: Soft, non-tender, non-distended. Bowel sounds present. No organomegaly or mass.  EXTREMITIES: No pedal edema, cyanosis, or clubbing.  NEUROLOGIC: Cranial nerves II through XII are intact. Muscle strength 5/5 in all extremities. Sensation intact. Gait not checked.  PSYCHIATRIC: The patient is alert and oriented x 3.  SKIN: Pressure Injury 09/01/20 Sacrum Stage 2 -  Partial thickness loss of dermis presenting as a shallow open injury with  a red, pink wound bed without slough. (Active)  09/01/20 0800  Location: Sacrum  Location Orientation:   Staging: Stage 2 -  Partial thickness loss of dermis presenting as a shallow open injury with a red, pink wound bed without slough.  Wound Description (Comments):   Present on Admission: Yes (pt stated she was told she had one there on admission.)        DATA REVIEW:   CBC  Recent Labs  Lab 09/02/20 0507  WBC 8.5  HGB 10.7*  HCT 32.3*  PLT 301    Chemistries  Recent Labs  Lab 09/02/20 0507  NA 135  K 3.5  CL 101  CO2 24  GLUCOSE 111*  BUN 14  CREATININE 0.48  CALCIUM 8.8*    Microbiology Results   Recent Results (from the past 240 hour(s))  Resp Panel by RT-PCR (Flu A&B, Covid) Nasopharyngeal Swab     Status: None   Collection Time: 08/27/20  3:15 PM   Specimen: Nasopharyngeal Swab; Nasopharyngeal(NP) swabs in vial transport medium  Result Value Ref Range Status   SARS  Coronavirus 2 by RT PCR NEGATIVE NEGATIVE Final    Comment: (NOTE) SARS-CoV-2 target nucleic acids are NOT DETECTED.  The SARS-CoV-2 RNA is generally detectable in upper respiratory specimens during the acute phase of infection. The lowest concentration of SARS-CoV-2 viral copies this assay can detect is 138 copies/mL. A negative result does not preclude SARS-Cov-2 infection and should not be used as the sole basis for treatment or other patient management decisions. A negative result may occur with  improper specimen collection/handling, submission of specimen other than nasopharyngeal swab, presence of viral mutation(s) within the areas targeted by this assay, and inadequate number of viral copies(<138 copies/mL). A negative result must be combined with clinical observations, patient history, and epidemiological information. The expected result is Negative.  Fact Sheet for Patients:  EntrepreneurPulse.com.au  Fact Sheet for Healthcare Providers:  IncredibleEmployment.be  This test is no t yet approved or cleared by the [place] FDA and  has been authorized for detection and/or diagnosis of SARS-CoV-2 by FDA under an Emergency Use Authorization (EUA). This EUA will remain  in effect (meaning this test can be used) for the duration of the COVID-19 declaration under Section 564(b)(1) of the Act, 21 U.S.C.section 360bbb-3(b)(1), unless the authorization is terminated  or revoked sooner.       Influenza A by PCR NEGATIVE NEGATIVE Final   Influenza B by PCR NEGATIVE NEGATIVE Final    Comment: (NOTE) The Xpert Xpress SARS-CoV-2/FLU/RSV plus assay is intended as an aid in the diagnosis of influenza from Nasopharyngeal swab specimens and should not be used as a sole basis for treatment. Nasal washings and aspirates are unacceptable for Xpert Xpress SARS-CoV-2/FLU/RSV testing.  Fact Sheet for  Patients: EntrepreneurPulse.com.au  Fact Sheet for Healthcare Providers: IncredibleEmployment.be  This test is not yet approved or cleared by the [place] FDA and has been authorized for detection and/or diagnosis of SARS-CoV-2 by FDA under an Emergency Use Authorization (EUA). This EUA will remain in effect (meaning this test can be used) for the duration of the COVID-19 declaration under Section 564(b)(1) of the Act, 21 U.S.C. section 360bbb-3(b)(1), unless the authorization is terminated or revoked.  Performed at [facility], [address], [place], [place] [address]   Surgical pcr screen     Status: None   Collection Time: 08/27/20 11:29 PM   Specimen: Nasal Mucosa; Nasal Swab  Result Value Ref Range Status  MRSA, PCR NEGATIVE NEGATIVE Final   Staphylococcus aureus NEGATIVE NEGATIVE Final    Comment: (NOTE) The Xpert SA Assay (FDA approved for NASAL specimens in patients 17 years of age and older), is one component of a comprehensive surveillance program. It is not intended to diagnose infection nor to guide or monitor treatment. Performed at [facility], [address], [place], [place] [address]     RADIOLOGY:  No results found.   CODE STATUS:     Code Status Orders  (From admission, onward)         Start     Ordered   08/27/20 1509  Full code  Continuous        08/27/20 1510        Code Status History    This patient has a current code status but no historical code status.   Advance Care Planning Activity    Advance Directive Documentation   Flowsheet Row Most Recent Value  Type of Advance Directive Healthcare Power of Attorney, Living will  Pre-existing out of facility DNR order (yellow form or pink MOST form) --  "MOST" Form in Place? --       TOTAL TIME TAKING CARE OF THIS PATIENT: *40* minutes.    Fritzi Mandes M.D  [facility]    CC: Primary care physician;  Dion Body, MD

## 2020-09-03 NOTE — Care Management Important Message (Signed)
Important Message  Patient Details  Name: Melanie Malone MRN: [id number] Date of Birth: [birth year]   Medicare Important Message Given:  Yes     Juliann Pulse A Ketura Sirek 09/03/2020, 11:11 AM

## 2020-09-03 NOTE — Progress Notes (Signed)
Physical Therapy Treatment Patient Details Name: Melanie Malone MRN: [id number] DOB: 1946/02/20 Today's Date: 09/03/2020    History of Present Illness Pt is a 75 y.o. female presenting to hospital with R hip pain (progressively worsening last 3 weeks); R leg gave out walking upstairs (did not fall) and unable to walk since.  Imaging showing acute pathologic subtrochanteric fx of proximal R femur through  mixed lytic and sclerotic bone lesion; also additional predeominantly lytic bony lesions throughout visualized portion of pelvis including large lytic lesion within L iliac bone measuring about 7.0 cm.  S/p 5/26 R IMN intertrochantric secondary to R hip fx subtrochanteric pathologic.  PMH includes h/o breast CA s/p double mastectomy, HLD, htn, PONV, arthritis (knees, lower back).    PT Comments    Pt was long sitting in bed upon arriving. Agreeable to PT session and cooperative and pleasant throughout. Mod assist to exit bed. Stood and took steps to [facility] prior to ambulating 20 ft with RW. Overall pt continues to progress towards goals. Will greatly benefit from SNF to address deficits while maximizing independence prior to returning home.   Follow Up Recommendations  SNF     Equipment Recommendations  Rolling walker with 5" wheels;3in1 (PT)       Precautions / Restrictions Precautions Precautions: Fall Precaution Comments: No BP L UE Restrictions Weight Bearing Restrictions: Yes RLE Weight Bearing: Weight bearing as tolerated    Mobility  Bed Mobility Overal bed mobility: Needs Assistance Bed Mobility: Supine to Sit     Supine to sit: Mod assist;HOB elevated     General bed mobility comments: Increased time to perform. mod assist to fully achieve EOB short sit from long sitting    Transfers Overall transfer level: Needs assistance Equipment used: Rolling walker (2 wheeled) Transfers: Sit to/from Stand Sit to Stand: Min assist         General transfer comment: pt stood form  EOB, BSC and from recliner during session  Ambulation/Gait Ambulation/Gait assistance: Min guard Gait Distance (Feet): 20 Feet Assistive device: Rolling walker (2 wheeled) Gait Pattern/deviations: Step-to pattern Gait velocity: decreased   General Gait Details: pt was able to advance gait quality and distance today. still very slow, step to, antalgic gait pattern      Balance Overall balance assessment: Modified Independent       Cognition Arousal/Alertness: Awake/alert Behavior During Therapy: WFL for tasks assessed/performed Overall Cognitive Status: Within Functional Limits for tasks assessed      General Comments: pt is A and O x 4. agreeable to session and motivated to improve to return home after rehab to care for husband             Pertinent Vitals/Pain Pain Assessment: 0-10 Pain Score: 5  Pain Location: right hip/thigh Pain Descriptors / Indicators: Aching;Guarding;Sharp;Shooting Pain Intervention(s): Limited activity within patient's tolerance;Monitored during session;Premedicated before session;Repositioned           PT Goals (current goals can now be found in the care plan section) Acute Rehab PT Goals Patient Stated Goal: rehab then home Progress towards PT goals: Progressing toward goals    Frequency    BID      PT Plan Current plan remains appropriate       AM-PAC PT "6 Clicks" Mobility   Outcome Measure  Help needed turning from your back to your side while in a flat bed without using bedrails?: A Lot Help needed moving from lying on your back to sitting on the side of a flat  bed without using bedrails?: A Little Help needed moving to and from a bed to a chair (including a wheelchair)?: A Lot Help needed standing up from a chair using your arms (e.g., wheelchair or bedside chair)?: A Lot Help needed to walk in hospital room?: A Lot Help needed climbing 3-5 steps with a railing? : A Lot 6 Click Score: 13    End of Session   Activity  Tolerance: Patient tolerated treatment well;Patient limited by fatigue;Patient limited by pain Patient left: in chair;with call bell/phone within reach;with chair alarm set Nurse Communication: Mobility status;Precautions;Weight bearing status PT Visit Diagnosis: Other abnormalities of gait and mobility (R26.89);Muscle weakness (generalized) (M62.81);History of falling (Z91.81);Pain Pain - Right/Left: Right Pain - part of body: Hip     Time: 1030-1058 PT Time Calculation (min) (ACUTE ONLY): 28 min  Charges:  $Gait Training: 8-22 mins $Therapeutic Activity: 8-22 mins                     Julaine Fusi PTA 09/03/20, 11:40 AM

## 2020-09-03 NOTE — Plan of Care (Signed)

## 2020-09-03 NOTE — Progress Notes (Signed)
Attempted calling report to [facility] with no response; will attempt again.

## 2020-09-03 NOTE — Plan of Care (Signed)
  Problem: Education: Goal: Knowledge of General Education information will improve Description: Including pain rating scale, medication(s)/side effects and non-pharmacologic comfort measures 09/03/2020 1447 by Jules Schick, RN Outcome: Completed/Met 09/03/2020 0827 by Jules Schick, RN Outcome: Progressing   Problem: Health Behavior/Discharge Planning: Goal: Ability to manage health-related needs will improve 09/03/2020 1447 by Jules Schick, RN Outcome: Completed/Met 09/03/2020 0827 by Jules Schick, RN Outcome: Progressing   Problem: Clinical Measurements: Goal: Ability to maintain clinical measurements within normal limits will improve 09/03/2020 1447 by Jules Schick, RN Outcome: Completed/Met 09/03/2020 0827 by Jules Schick, RN Outcome: Progressing Goal: Will remain free from infection 09/03/2020 1447 by Jules Schick, RN Outcome: Completed/Met 09/03/2020 0827 by Jules Schick, RN Outcome: Progressing Goal: Diagnostic test results will improve 09/03/2020 1447 by Jules Schick, RN Outcome: Completed/Met 09/03/2020 0827 by Jules Schick, RN Outcome: Progressing Goal: Respiratory complications will improve 09/03/2020 1447 by Jules Schick, RN Outcome: Completed/Met 09/03/2020 0827 by Jules Schick, RN Outcome: Progressing Goal: Cardiovascular complication will be avoided 09/03/2020 1447 by Jules Schick, RN Outcome: Completed/Met 09/03/2020 0827 by Jules Schick, RN Outcome: Progressing   Problem: Nutrition: Goal: Adequate nutrition will be maintained 09/03/2020 1447 by Jules Schick, RN Outcome: Completed/Met 09/03/2020 0827 by Jules Schick, RN Outcome: Progressing   Problem: Coping: Goal: Level of anxiety will decrease 09/03/2020 1447 by Jules Schick, RN Outcome: Completed/Met 09/03/2020 0827 by Jules Schick, RN Outcome: Progressing   Problem: Elimination: Goal: Will not experience complications related to bowel  motility 09/03/2020 1447 by Jules Schick, RN Outcome: Completed/Met 09/03/2020 0827 by Jules Schick, RN Outcome: Progressing Goal: Will not experience complications related to urinary retention 09/03/2020 1447 by Jules Schick, RN Outcome: Completed/Met 09/03/2020 0827 by Jules Schick, RN Outcome: Progressing   Problem: Pain Managment: Goal: General experience of comfort will improve 09/03/2020 1447 by Jules Schick, RN Outcome: Completed/Met 09/03/2020 0827 by Jules Schick, RN Outcome: Progressing   Problem: Safety: Goal: Ability to remain free from injury will improve 09/03/2020 1447 by Jules Schick, RN Outcome: Completed/Met 09/03/2020 0827 by Jules Schick, RN Outcome: Progressing   Problem: Skin Integrity: Goal: Risk for impaired skin integrity will decrease 09/03/2020 1447 by Jules Schick, RN Outcome: Completed/Met 09/03/2020 0827 by Jules Schick, RN Outcome: Progressing

## 2020-09-03 NOTE — TOC Progression Note (Signed)
Transition of Care [facility]) - Progression Note    Patient Details  Name: Melanie Malone MRN: [id number] Date of Birth: 02/21/1946  Transition of [facility]) CM/SW [place], RN Phone Number: 09/03/2020, 10:53 AM  Clinical Narrative:   Spoke with the patient and notified her that [facility] approved [facility] auth [facility], She is agreeable to go today and requested to go EMS , She understands that if insurance does not cover she will be responsible for the bill, She will have a covid test and then will DC, she will notifiy her family          Expected Discharge Plan and Services           Expected Discharge Date: 09/03/20                                     Social Determinants of Health (SDOH) Interventions    Readmission Risk Interventions No flowsheet data found.

## 2020-09-03 NOTE — TOC Progression Note (Signed)
Transition of Care [facility]) - Progression Note    Patient Details  Name: Melanie Malone MRN: [id number] Date of Birth: 1945-06-21  [facility] CM/SW [place], RN Phone Number: 09/03/2020, 2:12 PM  Clinical Narrative:    Patient going to [facility] room 606B She will notify her family I called [facility] at her request      Expected Discharge Plan and Services           Expected Discharge Date: 09/03/20                                     Social Determinants of Health (SDOH) Interventions    Readmission Risk Interventions No flowsheet data found.

## 2020-09-03 NOTE — TOC Progression Note (Signed)
Transition of Care [facility]) - Progression Note    Patient Details  Name: Melanie Malone MRN: [id number] Date of Birth: 1945/10/12  Transition of Care [facility]) CM/SW [place], RN Phone Number: 09/03/2020, 10:17 AM  Clinical Narrative:   Tammy at [facility] provided  Insurance approval for her to go to [facility], I notified the Doctor and nurse        Expected Discharge Plan and Services                                                 Social Determinants of Health (SDOH) Interventions    Readmission Risk Interventions No flowsheet data found.

## 2020-09-03 NOTE — Telephone Encounter (Signed)
Melissa- please schedule follow up on June 13th, MD; labs- cbc/cmp/Zometa.

## 2020-09-03 NOTE — Discharge Instructions (Signed)

## 2020-09-04 ENCOUNTER — Other Ambulatory Visit: Payer: Self-pay | Admitting: Internal Medicine

## 2020-09-04 ENCOUNTER — Telehealth: Payer: Self-pay | Admitting: *Deleted

## 2020-09-04 ENCOUNTER — Other Ambulatory Visit: Payer: Self-pay | Admitting: *Deleted

## 2020-09-04 DIAGNOSIS — C50919 Malignant neoplasm of unspecified site of unspecified female breast: Secondary | ICD-10-CM

## 2020-09-04 NOTE — Telephone Encounter (Addendum)
Patient daughter called stating that pt had been seen in hospital and had biopsy done while there and that patient has been discharged to [facility] now. She is asking if patient will be contacted with results and what about follow up. I did let her know that an appointment has been scheduled for 6/10 /22 @ 145 for physician/Lab/Inf and that this was given to the Hospital unit prior to her discharge so the facility should be aware of this and arrange for transportation to this appointment, bit advised that she speak with facility to confirm that they are aware of appointment and are arranging transportation. She agreed to do this.   She asked if Dr B will please call her mother to give her the biopsy results so that she does not have to wait for another week to get results. [phone]

## 2020-09-04 NOTE — Telephone Encounter (Signed)
Dr. Jacinto Reap I think Jerene Pitch got a call yesterday from floor and she put it on for 6/10 do we need to change it.?

## 2020-09-04 NOTE — Telephone Encounter (Signed)
C-Dont change- add zometa to visit; labs- cbc/cmp-GB

## 2020-09-05 ENCOUNTER — Telehealth: Payer: Self-pay | Admitting: Internal Medicine

## 2020-09-05 ENCOUNTER — Other Ambulatory Visit: Payer: Self-pay | Admitting: Internal Medicine

## 2020-09-05 NOTE — Telephone Encounter (Signed)
On 6/03-left voicemail for the patient to discuss results of the biopsy.  Spoke to patient's daughter Cindy-regarding the results of the biopsy-recurrent metastatic breast cancer; awaiting ER/PR HER2/neu.  Discussed unfortunately cancer is incurable; treatment recommended to slow the disease.   Messaged-pathology regarding ER/PR HER2/neu testing.

## 2020-09-05 NOTE — Telephone Encounter (Signed)
x

## 2020-09-07 ENCOUNTER — Telehealth: Payer: Self-pay | Admitting: Internal Medicine

## 2020-09-07 NOTE — Telephone Encounter (Signed)
On 6/05-I again left a message for the patient re: plan for follow-up.  Will inform path re: breast profile/NGS.  GB

## 2020-09-08 ENCOUNTER — Encounter: Payer: Self-pay | Admitting: Internal Medicine

## 2020-09-08 ENCOUNTER — Telehealth: Payer: Self-pay

## 2020-09-08 NOTE — Telephone Encounter (Signed)
Foundation one form completed and faxed to [employer] as well as pathology.  Also order faxed to add ER/PR/Her-2 to pathology.  These tests are being added to CASE: [id number].

## 2020-09-10 ENCOUNTER — Encounter: Payer: Self-pay | Admitting: Internal Medicine

## 2020-09-12 ENCOUNTER — Encounter: Payer: Self-pay | Admitting: Internal Medicine

## 2020-09-12 ENCOUNTER — Inpatient Hospital Stay: Payer: Medicare HMO

## 2020-09-12 ENCOUNTER — Inpatient Hospital Stay: Payer: Medicare HMO | Attending: Internal Medicine | Admitting: Internal Medicine

## 2020-09-12 ENCOUNTER — Other Ambulatory Visit: Payer: Self-pay

## 2020-09-12 DIAGNOSIS — C7951 Secondary malignant neoplasm of bone: Secondary | ICD-10-CM | POA: Diagnosis not present

## 2020-09-12 DIAGNOSIS — C50912 Malignant neoplasm of unspecified site of left female breast: Secondary | ICD-10-CM | POA: Insufficient documentation

## 2020-09-12 DIAGNOSIS — M899 Disorder of bone, unspecified: Secondary | ICD-10-CM

## 2020-09-12 DIAGNOSIS — Z79899 Other long term (current) drug therapy: Secondary | ICD-10-CM | POA: Insufficient documentation

## 2020-09-12 DIAGNOSIS — C50919 Malignant neoplasm of unspecified site of unspecified female breast: Secondary | ICD-10-CM

## 2020-09-12 DIAGNOSIS — Z170 Estrogen receptor positive status [ER+]: Secondary | ICD-10-CM | POA: Diagnosis not present

## 2020-09-12 LAB — CBC WITH DIFFERENTIAL/PLATELET
Abs Immature Granulocytes: 0.09 10*3/uL — ABNORMAL HIGH (ref 0.00–0.07)
Basophils Absolute: 0 10*3/uL (ref 0.0–0.1)
Basophils Relative: 0 %
Eosinophils Absolute: 0.1 10*3/uL (ref 0.0–0.5)
Eosinophils Relative: 2 %
HCT: 36.6 % (ref 36.0–46.0)
Hemoglobin: 12.2 g/dL (ref 12.0–15.0)
Immature Granulocytes: 1 %
Lymphocytes Relative: 21 %
Lymphs Abs: 1.9 10*3/uL (ref 0.7–4.0)
MCH: 30.2 pg (ref 26.0–34.0)
MCHC: 33.3 g/dL (ref 30.0–36.0)
MCV: 90.6 fL (ref 80.0–100.0)
Monocytes Absolute: 1.1 10*3/uL — ABNORMAL HIGH (ref 0.1–1.0)
Monocytes Relative: 12 %
Neutro Abs: 5.9 10*3/uL (ref 1.7–7.7)
Neutrophils Relative %: 64 %
Platelets: 421 10*3/uL — ABNORMAL HIGH (ref 150–400)
RBC: 4.04 MIL/uL (ref 3.87–5.11)
RDW: 13.2 % (ref 11.5–15.5)
WBC: 9.3 10*3/uL (ref 4.0–10.5)
nRBC: 0 % (ref 0.0–0.2)

## 2020-09-12 LAB — COMPREHENSIVE METABOLIC PANEL
ALT: 26 U/L (ref 0–44)
AST: 20 U/L (ref 15–41)
Albumin: 3.9 g/dL (ref 3.5–5.0)
Alkaline Phosphatase: 160 U/L — ABNORMAL HIGH (ref 38–126)
Anion gap: 13 (ref 5–15)
BUN: 15 mg/dL (ref 8–23)
CO2: 25 mmol/L (ref 22–32)
Calcium: 9.6 mg/dL (ref 8.9–10.3)
Chloride: 94 mmol/L — ABNORMAL LOW (ref 98–111)
Creatinine, Ser: 0.59 mg/dL (ref 0.44–1.00)
GFR, Estimated: >60 mL/min (ref >60)
Glucose, Bld: 104 mg/dL — ABNORMAL HIGH (ref 70–99)
Potassium: 3.8 mmol/L (ref 3.5–5.1)
Sodium: 132 mmol/L — ABNORMAL LOW (ref 135–145)
Total Bilirubin: 0.6 mg/dL (ref 0.3–1.2)
Total Protein: 7.5 g/dL (ref 6.5–8.1)

## 2020-09-12 MED ORDER — ZOLEDRONIC ACID 4 MG/100ML IV SOLN
4.0000 mg | Freq: Once | INTRAVENOUS | Status: AC
Start: 2020-09-12 — End: 2020-09-12
  Administered 2020-09-12: 4 mg via INTRAVENOUS
  Filled 2020-09-12: qty 100

## 2020-09-12 MED ORDER — SODIUM CHLORIDE 0.9 % IV SOLN
Freq: Once | INTRAVENOUS | Status: AC
  Filled 2020-09-12: qty 250

## 2020-09-12 NOTE — Patient Instructions (Signed)
[facility] [place] [facility]  Discharge Instructions: Thank you for choosing [facility] to provide your oncology and hematology care.  If you have a lab appointment with the Cancer Center, please go directly to the Cancer Center and check in at the registration area.  Wear comfortable clothing and clothing appropriate for easy access to any Portacath or PICC line.   We strive to give you quality time with your provider. You may need to reschedule your appointment if you arrive late (15 or more minutes).  Arriving late affects you and other patients whose appointments are after yours.  Also, if you miss three or more appointments without notifying the office, you may be dismissed from the clinic at the provider's discretion.      For prescription refill requests, have your pharmacy contact our office and allow 72 hours for refills to be completed.    Today you received the following chemotherapy and/or immunotherapy agents ZOMETA      To help prevent nausea and vomiting after your treatment, we encourage you to take your nausea medication as directed.  BELOW ARE SYMPTOMS THAT SHOULD BE REPORTED IMMEDIATELY: *FEVER GREATER THAN 100.4 F (38 C) OR HIGHER *CHILLS OR SWEATING *NAUSEA AND VOMITING THAT IS NOT CONTROLLED WITH YOUR NAUSEA MEDICATION *UNUSUAL SHORTNESS OF BREATH *UNUSUAL BRUISING OR BLEEDING *URINARY PROBLEMS (pain or burning when urinating, or frequent urination) *BOWEL PROBLEMS (unusual diarrhea, constipation, pain near the anus) TENDERNESS IN MOUTH AND THROAT WITH OR WITHOUT PRESENCE OF ULCERS (sore throat, sores in mouth, or a toothache) UNUSUAL RASH, SWELLING OR PAIN  UNUSUAL VAGINAL DISCHARGE OR ITCHING   Items with * indicate a potential emergency and should be followed up as soon as possible or go to the Emergency Department if any problems should occur.  Please show the CHEMOTHERAPY ALERT CARD or IMMUNOTHERAPY ALERT CARD at check-in to  the Emergency Department and triage nurse.  Should you have questions after your visit or need to cancel or reschedule your appointment, please contact [facility] [place] [facility]  [phone] and follow the prompts.  Office hours are 8:00 a.m. to 4:30 p.m. Monday - Friday. Please note that voicemails left after 4:00 p.m. may not be returned until the following business day.  We are closed weekends and major holidays. You have access to a nurse at all times for urgent questions. Please call the main number to the clinic [phone] and follow the prompts.  For any non-urgent questions, you may also contact your provider using MyChart. We now offer e-Visits for anyone 18 and older to request care online for non-urgent symptoms. For details visit [email or web address].   Also download the MyChart app! Go to the app store, search "MyChart", open the app, select , and log in with your MyChart username and password.  Due to Covid, a mask is required upon entering the hospital/clinic. If you do not have a mask, one will be given to you upon arrival. For doctor visits, patients may have 1 support person aged 18 or older with them. For treatment visits, patients cannot have anyone with them due to current Covid guidelines and our immunocompromised population.   Zoledronic Acid Injection (Hypercalcemia, Oncology) What is this medication? ZOLEDRONIC ACID (ZOE le dron ik AS id) slows calcium loss from bones. It high calcium levels in the blood from some kinds of cancer. It may be used in other people at risk for bone loss. This medicine may be used for other purposes; ask   your health care provider or pharmacist if you have questions. COMMON BRAND NAME(S): Zometa What should I tell my care team before I take this medication? They need to know if you have any of these conditions: cancer dehydration dental disease kidney disease liver disease low levels of calcium in the  blood lung or breathing disease (asthma) receiving steroids like dexamethasone or prednisone an unusual or allergic reaction to zoledronic acid, other medicines, foods, dyes, or preservatives pregnant or trying to get pregnant breast-feeding How should I use this medication? This drug is injected into a vein. It is given by a health care provider in a hospital or clinic setting. Talk to your health care provider about the use of this drug in children. Special care may be needed. Overdosage: If you think you have taken too much of this medicine contact a poison control center or emergency room at once. NOTE: This medicine is only for you. Do not share this medicine with others. What if I miss a dose? Keep appointments for follow-up doses. It is important not to miss your dose. Call your health care provider if you are unable to keep an appointment. What may interact with this medication? certain antibiotics given by injection NSAIDs, medicines for pain and inflammation, like ibuprofen or naproxen some diuretics like bumetanide, furosemide teriparatide thalidomide This list may not describe all possible interactions. Give your health care provider a list of all the medicines, herbs, non-prescription drugs, or dietary supplements you use. Also tell them if you smoke, drink alcohol, or use illegal drugs. Some items may interact with your medicine. What should I watch for while using this medication? Visit your health care provider for regular checks on your progress. It may be some time before you see the benefit from this drug. Some people who take this drug have severe bone, joint, or muscle pain. This drug may also increase your risk for jaw problems or a broken thigh bone. Tell your health care provider right away if you have severe pain in your jaw, bones, joints, or muscles. Tell you health care provider if you have any pain that does not go away or that gets worse. Tell your dentist and  dental surgeon that you are taking this drug. You should not have major dental surgery while on this drug. See your dentist to have a dental exam and fix any dental problems before starting this drug. Take good care of your teeth while on this drug. Make sure you see your dentist for regular follow-up appointments. You should make sure you get enough calcium and vitamin D while you are taking this drug. Discuss the foods you eat and the vitamins you take with your health care provider. Check with your health care provider if you have severe diarrhea, nausea, and vomiting, or if you sweat a lot. The loss of too much body fluid may make it dangerous for you to take this drug. You may need blood work done while you are taking this drug. Do not become pregnant while taking this drug. Women should inform their health care provider if they wish to become pregnant or think they might be pregnant. There is potential for serious harm to an unborn child. Talk to your health care provider for more information. What side effects may I notice from receiving this medication? Side effects that you should report to your doctor or health care provider as soon as possible: allergic reactions (skin rash, itching or hives; swelling of the face, lips, or   tongue) bone pain infection (fever, chills, cough, sore throat, pain or trouble passing urine) jaw pain, especially after dental work joint pain kidney injury (trouble passing urine or change in the amount of urine) low blood pressure (dizziness; feeling faint or lightheaded, falls; unusually weak or tired) low calcium levels (fast heartbeat; muscle cramps or pain; pain, tingling, or numbness in the hands or feet; seizures) low magnesium levels (fast, irregular heartbeat; muscle cramp or pain; muscle weakness; tremors; seizures) low red blood cell counts (trouble breathing; feeling faint; lightheaded, falls; unusually weak or tired) muscle pain redness, blistering,  peeling, or loosening of the skin, including inside the mouth severe diarrhea swelling of the ankles, feet, hands trouble breathing Side effects that usually do not require medical attention (report to your doctor or health care provider if they continue or are bothersome): anxious constipation coughing depressed mood eye irritation, itching, or pain fever general ill feeling or flu-like symptoms nausea pain, redness, or irritation at site where injected trouble sleeping This list may not describe all possible side effects. Call your doctor for medical advice about side effects. You may report side effects to FDA at 1-800-FDA-1088. Where should I keep my medication? This drug is given in a hospital or clinic. It will not be stored at home. NOTE: This sheet is a summary. It may not cover all possible information. If you have questions about this medicine, talk to your doctor, pharmacist, or health care provider.  2022 Elsevier/Gold Standard (2019-01-04 09:13:00)  

## 2020-09-12 NOTE — Assessment & Plan Note (Signed)
#   75 year old female patient with remote history of breast cancer  right femoral fracture/suspected pathologic  # Multiple bone lesions noted on CT of the right hip/pathology fracture right femur neck-highly concerning for malignancy/especially given history of breast cancer.  S/p ORIF; bone biopsy.  Breast tumor markers elevated; suggestive of recurrent breast cancer.  However pathology still pending.  CT scan abdomen pelvis no evidence of any visceral disease.  We will plan outpatient radiation after healing of the surgical incision.  Patient will also need to be on Zometa; also start AI plus CDK inhibitor.

## 2020-09-12 NOTE — Assessment & Plan Note (Addendum)
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  CT scan chest and pelvis no evidence of visceral disease.  Recommend PET scan/bone scan for further evaluation of bone disease.    #Awaiting ER/PR HER2/neu status.  However given prior history of ER positive disease-I think is reasonable to start aromatase inhibitor; while awaiting for ER/PR-start with CDK inhibitor.  Start prescription for anastrozole.  Discussed the potential side effects including but not limited to hot flashes joint pains osteoporosis etc.  #Metastatic bone disease/right hip pathologic fracture status post ORIF-recommend Zometa; recommend evaluation with radiation oncology for postoperative radiation.  Plan Zometa today discussed the potential side effects including but not limited to infusion related joint pains/hypocalcemia; rare side effect of Osteonecrosis of the jaw.  #Prognosis: Unfortunately patient's cancer is incurable; however the median survival is in order of 4-5 years.   # DISPOSITION: # labs- today-cbc/cmp # zometa today # Referral to Dr.Crystal- next week-re: right hip fracture/ breast cancer #PET scan in week of June 20th # will follow up in 4 weeks; MD Labs- cbc/cmp/ca-27-29; Possible Zometa-Dr.B  # 40 minutes face-to-face with the patient discussing the above plan of care; more than 50% of time spent on prognosis/ natural history; counseling and coordination.

## 2020-09-12 NOTE — Progress Notes (Signed)
[name] CONSULT NOTE  Patient Care Team: Dion Body, MD as PCP - General (Family Medicine)  CHIEF COMPLAINTS/PURPOSE OF CONSULTATION:  Breast cancer  #  Oncology History Overview Note  # history of breast cancer on the left side in 2003-s/p lumpectomy followed by chemotherapy and radiation.  As per patient reports-stage II [approximate 2 cm primary tumor; 21 lymph nodes negative; reports receiving "red chemo".  Unaware if HER2 positive] as per patient she was started on tamoxifen-.  1 year because of intolerance.  Patient states to have lump in the same breast which was again concerning for malignancy but subsequently proven benign.  However patient underwent bilateral prophylactic mastectomy with reconstruction.  Patient's oncologist was Dr. Anastasia Pall in [place], [place].   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 > 90%/PR-NEG HER2 +1/NEGATIVE  # NGS/MOLECULAR TESTS: PIC3K*    # PALLIATIVE CARE EVALUATION:  # PAIN MANAGEMENT:    DIAGNOSIS: RECURRENT BREAST CANCER  STAGE:  IV       ;  GOALS: palliative care  CURRENT/MOST RECENT THERAPY : Anastrazole    Breast cancer metastasized to bone, left ([place])  09/12/2020 Initial Diagnosis   Breast cancer metastasized to bone, left ([place])   09/18/2020 Cancer Staging   Staging form: Breast, AJCC 8th Edition - Clinical: Stage IV (cTX, cNX, cM1, GX, ER+, PR-, HER2-) - Signed by Cammie Sickle, MD on 09/18/2020  Histologic grading system: 3 grade system       HISTORY OF PRESENTING ILLNESS:  Melanie Malone 75 y.o.  female with remote history of breast cancer was recently admitted to hospital for pathologic fracture of the right hip.  Patient had ORIF; bone biopsy.  Pathology positive for recurrent breast cancer.  Patient is accompanied by daughter to discuss treatment options.  Patient is currently at rehab.  She is  awaiting to be discharged to rehab soon.  Patient is taking tramadol approximately times a day for pain.  Otherwise no falls.  No nausea no vomiting.  No headaches.   Review of Systems  Constitutional:  Negative for chills, diaphoresis, fever, malaise/fatigue and weight loss.  HENT:  Negative for nosebleeds and sore throat.   Eyes:  Negative for double vision.  Respiratory:  Negative for cough, hemoptysis, sputum production, shortness of breath and wheezing.   Cardiovascular:  Negative for chest pain, palpitations, orthopnea and leg swelling.  Gastrointestinal:  Negative for abdominal pain, blood in stool, constipation, diarrhea, heartburn, melena, nausea and vomiting.  Genitourinary:  Negative for dysuria, frequency and urgency.  Musculoskeletal:  Positive for joint pain. Negative for back pain.  Skin: Negative.  Negative for itching and rash.  Neurological:  Negative for dizziness, tingling, focal weakness, weakness and headaches.  Endo/Heme/Allergies:  Does not bruise/bleed easily.  Psychiatric/Behavioral:  Negative for depression. The patient is not nervous/anxious and does not have insomnia.     MEDICAL HISTORY:  Past Medical History:  Diagnosis Date   Arthritis    knees, lower back   Breast CA ([place])    Hyperlipidemia    Hypertension    PONV (postoperative nausea and vomiting)     SURGICAL HISTORY: Past Surgical History:  Procedure Laterality Date   ABDOMINAL HYSTERECTOMY     BREAST LUMPECTOMY     BREAST RECONSTRUCTION Bilateral    BUNIONECTOMY Right    CATARACT EXTRACTION W/PHACO Left 01/14/2020   Procedure: CATARACT EXTRACTION PHACO AND INTRAOCULAR LENS PLACEMENT ([place]) LEFT;  Surgeon: Benay Pillow  Elta Guadeloupe, MD;  Location: [facility];  Service: Ophthalmology;  Laterality: Left;  6.92 0:54.0   CATARACT EXTRACTION W/PHACO Right 02/11/2020   Procedure: CATARACT EXTRACTION PHACO AND INTRAOCULAR LENS PLACEMENT ([facility]) RIGHT 13.20 01:13.6;  Surgeon: Eulogio Bear, MD;   Location: [facility];  Service: Ophthalmology;  Laterality: Right;   INTRAMEDULLARY (IM) NAIL INTERTROCHANTERIC Right 08/28/2020   Procedure: INTRAMEDULLARY (IM) NAIL INTERTROCHANTRIC;  Surgeon: Hessie Knows, MD;  Location: [facility];  Service: Orthopedics;  Laterality: Right;   MASTECTOMY Bilateral     SOCIAL HISTORY: Social History   Socioeconomic History   Marital status: Married    Spouse name: Not on file   Number of children: Not on file   Years of education: Not on file   Highest education level: Not on file  Occupational History   Not on file  Tobacco Use   Smoking status: Never   Smokeless tobacco: Never  Vaping Use   Vaping Use: Never used  Substance and Sexual Activity   Alcohol use: Not Currently   Drug use: Not on file   Sexual activity: Not on file  Other Topics Concern   Not on file  Social History Narrative   Patient moved from [place] about 5 years ago to live closer to her husband's family.  Husband with scleroderma/caregiver.  Used to work in [employer] research (medical).  No smoking.  No alcohol abuse.  Patient's daughter lives in [place] and [place]   Social Determinants of Health   Financial Resource Strain: Not on file  Food Insecurity: Not on file  Transportation Needs: Not on file  Physical Activity: Not on file  Stress: Not on file  Social Connections: Not on file  Intimate Partner Violence: Not on file    FAMILY HISTORY: Family History  Problem Relation Age of Onset   Hypertension Mother     ALLERGIES:  is allergic to codeine, scallops [shellfish allergy], vicodin hp [hydrocodone-acetaminophen], other, and red dye.  MEDICATIONS:  Current Outpatient Medications  Medication Sig Dispense Refill   anastrozole (ARIMIDEX) 1 MG tablet Take 1 tablet (1 mg total) by mouth daily. 90 tablet 1   lisinopril (ZESTRIL) 20 MG tablet Take 20 mg by mouth daily. (Patient not taking: Reported on 09/17/2020)     simvastatin (ZOCOR) 40 MG tablet Take 40 mg by mouth  daily.     traMADol (ULTRAM) 50 MG tablet Take 1 tablet (50 mg total) by mouth every 6 (six) hours as needed for moderate pain. 30 tablet 0   vitamin B-12 (CYANOCOBALAMIN) 500 MCG tablet Take 500 mcg by mouth daily.     Vitamin D3 (VITAMIN D) 25 MCG tablet Take 1,000 Units by mouth daily.     acetaminophen (TYLENOL) 500 MG tablet Take 500 mg by mouth 2 (two) times daily as needed.     calcium citrate-vitamin D (CITRACAL+D) 315-200 MG-UNIT tablet Take 1 tablet by mouth 2 (two) times daily.     GLUCOSAMINE-CHONDROITIN PO Take by mouth daily. [place] Tamaflex (Patient not taking: No sig reported)     hydrochlorothiazide (HYDRODIURIL) 25 MG tablet Take 25 mg by mouth daily. (Patient not taking: No sig reported)     ibuprofen (ADVIL) 200 MG tablet Take 600 mg by mouth every 6 (six) hours as needed for moderate pain.     No current facility-administered medications for this visit.      Marland Kitchen  PHYSICAL EXAMINATION: ECOG PERFORMANCE STATUS: 1 - Symptomatic but completely ambulatory  Vitals:   09/12/20 1335  BP: 99/69  Pulse: 81  Temp: 98 F (36.7 C)  SpO2: 99%   Filed Weights   09/12/20 1335  Weight: 214 lb 4.8 oz (97.2 kg)    Physical Exam Vitals and nursing note reviewed.  Constitutional:      Comments: Ambulating: assistive devices [recent hip surgery]  Accompanied: Daughter  HENT:     Head: Normocephalic and atraumatic.     Mouth/Throat:     Pharynx: Oropharynx is clear.  Eyes:     Extraocular Movements: Extraocular movements intact.     Pupils: Pupils are equal, round, and reactive to light.  Cardiovascular:     Rate and Rhythm: Normal rate and regular rhythm.  Pulmonary:     Effort: Pulmonary effort is normal.     Breath sounds: Normal breath sounds.  Abdominal:     Palpations: Abdomen is soft.  Musculoskeletal:        General: Normal range of motion.     Cervical back: Normal range of motion.  Skin:    General: Skin is warm.  Neurological:     General: No focal  deficit present.     Mental Status: She is alert and oriented to person, place, and time.  Psychiatric:        Behavior: Behavior normal.        Judgment: Judgment normal.     LABORATORY DATA:  I have reviewed the data as listed Lab Results  Component Value Date   WBC 9.3 09/12/2020   HGB 12.2 09/12/2020   HCT 36.6 09/12/2020   MCV 90.6 09/12/2020   PLT 421 (H) 09/12/2020   Recent Labs    08/29/20 0416 09/02/20 0507 09/12/20 1436  NA 134* 135 132*  K 3.8 3.5 3.8  CL 102 101 94*  CO2 '23 24 25  ' GLUCOSE 137* 111* 104*  BUN '13 14 15  ' CREATININE 0.58 0.48 0.59  CALCIUM 8.6* 8.8* 9.6  GFRNONAA >60 >60 >60  PROT  --   --  7.5  ALBUMIN  --   --  3.9  AST  --   --  20  ALT  --   --  26  ALKPHOS  --   --  160*  BILITOT  --   --  0.6    RADIOGRAPHIC STUDIES: I have personally reviewed the radiological images as listed and agreed with the findings in the report. CT ABDOMEN PELVIS WO CONTRAST  Result Date: 08/30/2020 CLINICAL DATA:  Breast cancer, staging EXAM: CT ABDOMEN AND PELVIS WITHOUT CONTRAST TECHNIQUE: Multidetector CT imaging of the abdomen and pelvis was performed following the standard protocol without IV contrast. COMPARISON:  Partial comparison to CT chest dated 08/27/2020 FINDINGS: Lower chest: Small left pleural effusion with associated left basilar atelectasis. Trace right pleural fluid. Hepatobiliary: Unenhanced liver is grossly unremarkable. Status post cholecystectomy. No intrahepatic or extrahepatic ductal dilatation. Pancreas: Within normal limits. Spleen: Within normal limits. Adrenals/Urinary Tract: Adrenal glands are within normal limits. Kidneys are within normal limits. No renal calculi or hydronephrosis. Trace nondependent gas in the bladder (series 2/image 36), likely related to instrumentation. Stomach/Bowel: Stomach is notable for a tiny hiatal hernia. No evidence of bowel obstruction. Normal appendix (series 2/image 51). Vascular/Lymphatic: No evidence of  abdominal aortic aneurysm. Atherosclerotic calcifications of the abdominal aorta and branch vessels. No suspicious abdominopelvic lymphadenopathy. Reproductive: Status post hysterectomy. No adnexal masses. Other: No abdominopelvic ascites. Musculoskeletal: Bilateral breast augmentation, incompletely visualized. Grade 1 spondylolisthesis at L5-S1. Degenerative changes of the visualized thoracolumbar spine. Postprocedural changes related to  ORIF of the right hip. Lytic/sclerotic lesions involving the bilateral iliac bones (series 2/images 67 and 71) and the right proximal femur (series 2/image 97), raising concern for metastases given the clinical history. IMPRESSION: Lytic/sclerotic lesions involving the pelvis, raising concern for osseous metastases given the clinical history of breast cancer. Otherwise, no findings suspicious for metastatic disease in the abdomen/pelvis on unenhanced CT. Small left pleural effusion with associated left lower lobe atelectasis. Electronically Signed   By: Julian Hy M.D.   On: 08/30/2020 05:39   DG Chest 1 View  Result Date: 08/27/2020 CLINICAL DATA:  Right hip pain after fall. EXAM: CHEST  1 VIEW COMPARISON:  None. FINDINGS: Normal cardiac size. Right paratracheal prominence is noted which may represent dilated aorta or other vascular structure, but adenopathy cannot be excluded. Both lungs are clear. The visualized skeletal structures are unremarkable. IMPRESSION: Right paratracheal prominence is noted which may be vascular in etiology, but CT scan of the chest with intravenous contrast is recommended to rule out adenopathy or other pathology. Electronically Signed   By: Marijo Conception M.D.   On: 08/27/2020 14:34   CT Chest W Contrast  Result Date: 08/27/2020 CLINICAL DATA:  Right paratracheal prominence at chest radiography. EXAM: CT CHEST WITH CONTRAST TECHNIQUE: Multidetector CT imaging of the chest was performed during intravenous contrast administration.  CONTRAST:  45m OMNIPAQUE IOHEXOL 300 MG/ML  SOLN COMPARISON:  Chest radiography same day FINDINGS: Cardiovascular: Heart size upper limits of normal. No pericardial fluid. Some coronary artery calcification. Some aortic atherosclerotic calcification. No pulmonary artery pathology visible. Mediastinum/Nodes: No mediastinal or hilar mass or lymphadenopathy. Specifically, no right paratracheal adenopathy or mass. Lungs/Pleura: Minimal pulmonary scarring. No infiltrate or collapse. Small amount of pleural fluid or pleural thickening on the left with minimal dependent atelectasis at the left lung base. 1 cm nodule or focal scar in the lateral left lower lobe. Upper Abdomen: No significant upper abdominal finding. Musculoskeletal: Mild curvature in degenerative change of the thoracic spine. No evidence of thoracic fracture. No sternal fracture. No rib fracture. IMPRESSION: No paratracheal mass or lymphadenopathy. Some coronary artery calcification. Mild aortic atherosclerotic calcification. Small left pleural effusion layering dependently with dependent pulmonary atelectasis. 1 cm nodule or scar in the lateral left lower lobe, axial image 85. Scar is favored. Consider one of the following in 3 months for both low-risk and high-risk individuals: (a) repeat chest CT, (b) follow-up PET-CT, or (c) tissue sampling. This recommendation follows the consensus statement: Guidelines for Management of Incidental Pulmonary Nodules Detected on CT Images: From the Fleischner Society 2017; Radiology 2017; 284:228-243. Aortic Atherosclerosis (ICD10-I70.0). Electronically Signed   By: MNelson ChimesM.D.   On: 08/27/2020 15:19   CT Hip Right Wo Contrast  Result Date: 08/27/2020 CLINICAL DATA:  Right femur fracture.  History of breast cancer EXAM: CT OF THE RIGHT HIP WITHOUT CONTRAST TECHNIQUE: Multidetector CT imaging of the right hip was performed according to the standard protocol. Multiplanar CT image reconstructions were also  generated. COMPARISON:  X-ray 08/27/2020 FINDINGS: Bones/Joint/Cartilage Acute subtrochanteric fracture of the proximal right femur just along the inferior margin of the lesser trochanter. Prominent varus angulation at the fracture site. Nondisplaced fracture extends through the lesser trochanter. Mixed lytic and sclerotic appearance of the underlying bone within this region suspicious for pathologic fracture (series 6, image 66-69). Endosteal scalloping noted along the posterior margin of the subtrochanteric left femur (series 2, image 84). No fracture involvement of the femoral neck or femoral head. Hip joint is intact  without dislocation. There is a large lytic lesion within the left iliac bone measuring at least 7.0 cm in maximal dimension (series 6, images 77-96). Additional smaller lytic lesion within the right iliac bone adjacent to the right sacroiliac joint measuring up to 2.3 cm (series 6, image 80). Additional ill-defined area of patchy sclerosis within the anterior aspect of the right iliac crest, likely secondary to an underlying lesion (series 2, image 19). Ligaments Suboptimally assessed by CT. Muscles and Tendons No acute musculotendinous injury by CT. Right gluteus medius and minimus muscle atrophy. Soft tissues No soft tissue edema or fluid collection. No right inguinal lymphadenopathy. IMPRESSION: 1. Acute pathologic subtrochanteric fracture of the proximal right femur through a mixed lytic and sclerotic bone lesion. 2. Additional predominantly lytic bony lesions throughout the visualized portion of the pelvis including large lytic lesion within the left iliac bone measuring approximately 7.0 cm. Findings are highly suspicious for metastatic disease given history of breast cancer. Electronically Signed   By: Davina Poke D.O.   On: 08/27/2020 15:40   DG HIP OPERATIVE UNILAT W OR W/O PELVIS RIGHT  Result Date: 08/28/2020 CLINICAL DATA:  Right femur IM nail. EXAM: OPERATIVE RIGHT HIP (WITH  PELVIS IF PERFORMED) TECHNIQUE: Fluoroscopic spot image(s) were submitted for interpretation post-operatively. COMPARISON:  Preoperative radiograph yesterday. FINDINGS: Five fluoroscopic spot views of the right hip obtained in the operating room. Intramedullary nail with trans trochanteric and distal locking screw traverse proximal femur fracture in improved alignment. Total fluoroscopy time 3 minutes 24 seconds. IMPRESSION: Procedural fluoroscopy during ORIF proximal femur fracture. Electronically Signed   By: Keith Rake M.D.   On: 08/28/2020 17:06   DG Hip Unilat  With Pelvis 2-3 Views Right  Addendum Date: 08/27/2020   ADDENDUM REPORT: 08/27/2020 15:50 ADDENDUM: Areas of sclerosis and lucency involving the left ilium. There are also areas of lucency involving the right trochanteric region. These findings are suggestive for bone lesions and pathologic fracture of the proximal right femur. These findings are better characterized on the right hip CT. Electronically Signed   By: Markus Daft M.D.   On: 08/27/2020 15:50   Result Date: 08/27/2020 CLINICAL DATA:  Right hip pain and fall. EXAM: DG HIP (WITH OR WITHOUT PELVIS) 2-3V RIGHT COMPARISON:  None FINDINGS: Displaced and slightly angulated fracture of the proximal right femur. The fracture appears to be involving the subtrochanteric region although there may be a component in the right greater trochanter. Pelvic bony ring appears to be intact. Sclerosis and degenerative changes in lower lumbar spine. Normal appearance of the left hip on the pelvic view. Right hip joint is located. IMPRESSION: Displaced right subtrochanteric femur fracture with probable trochanteric extension. Electronically Signed: By: Markus Daft M.D. On: 08/27/2020 14:33    ASSESSMENT & PLAN:   Breast cancer metastasized to bone [facility]) # 75 year old female patient with remote history of breast cancer  right femoral fracture/suspected pathologic  # Multiple bone lesions noted on CT  of the right hip/pathology fracture right femur neck-highly concerning for malignancy/especially given history of breast cancer.  S/p ORIF; bone biopsy.  Breast tumor markers elevated; suggestive of recurrent breast cancer.  However pathology still pending.  CT scan abdomen pelvis no evidence of any visceral disease.  We will plan outpatient radiation after healing of the surgical incision.  Patient will also need to be on Zometa; also start AI plus CDK inhibitor.  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  CT scan chest and pelvis no evidence of visceral disease.  Recommend PET scan/bone scan for further evaluation of bone disease.    #Awaiting ER/PR HER2/neu status.  However given prior history of ER positive disease-I think is reasonable to start aromatase inhibitor; while awaiting for ER/PR-start with CDK inhibitor.  Start prescription for anastrozole.  Discussed the potential side effects including but not limited to hot flashes joint pains osteoporosis etc.  #Metastatic bone disease/right hip pathologic fracture status post ORIF-recommend Zometa; recommend evaluation with radiation oncology for postoperative radiation.  Plan Zometa today discussed the potential side effects including but not limited to infusion related joint pains/hypocalcemia; rare side effect of Osteonecrosis of the jaw.  #Prognosis: Unfortunately patient's cancer is incurable; however the median survival is in order of 4-5 years.   # DISPOSITION: # labs- today-cbc/cmp # zometa today # Referral to Dr.Crystal- next week-re: right hip fracture/ breast cancer #PET scan in week of June 20th # will follow up in 4 weeks; MD Labs- cbc/cmp/ca-27-29; Possible Zometa-Dr.B  # 40 minutes face-to-face with the patient discussing the above plan of care; more than 50% of time spent on prognosis/ natural history; counseling and coordination.    All questions  were answered. The patient knows to call the clinic with any problems, questions or concerns.       Cammie Sickle, MD 09/18/2020 8:59 PM

## 2020-09-15 LAB — SURGICAL PATHOLOGY

## 2020-09-17 ENCOUNTER — Other Ambulatory Visit: Payer: Self-pay

## 2020-09-17 ENCOUNTER — Ambulatory Visit
Admission: RE | Admit: 2020-09-17 | Discharge: 2020-09-17 | Disposition: A | Discharge status: Home / Self Care | Payer: Medicare HMO | Source: Ambulatory Visit | Attending: Radiation Oncology | Admitting: Radiation Oncology

## 2020-09-17 VITALS — BP 135/81 | HR 77 | Temp 97.7°F | Resp 18 | Wt 216.8 lb

## 2020-09-17 DIAGNOSIS — Z79899 Other long term (current) drug therapy: Secondary | ICD-10-CM | POA: Insufficient documentation

## 2020-09-17 DIAGNOSIS — E785 Hyperlipidemia, unspecified: Secondary | ICD-10-CM | POA: Diagnosis not present

## 2020-09-17 DIAGNOSIS — C7951 Secondary malignant neoplasm of bone: Secondary | ICD-10-CM | POA: Insufficient documentation

## 2020-09-17 DIAGNOSIS — C50912 Malignant neoplasm of unspecified site of left female breast: Secondary | ICD-10-CM

## 2020-09-17 DIAGNOSIS — C50911 Malignant neoplasm of unspecified site of right female breast: Secondary | ICD-10-CM | POA: Diagnosis not present

## 2020-09-17 DIAGNOSIS — I10 Essential (primary) hypertension: Secondary | ICD-10-CM | POA: Insufficient documentation

## 2020-09-17 DIAGNOSIS — Z170 Estrogen receptor positive status [ER+]: Secondary | ICD-10-CM | POA: Insufficient documentation

## 2020-09-17 NOTE — Consult Note (Signed)
NEW PATIENT EVALUATION  Name: Melanie Malone  MRN: [id number]  Date:   09/17/2020     DOB: 11-07-1945   This 75 y.o. female patient presents to the clinic for initial evaluation of bone mets to right hip and patient with stage IV breast cancer.  REFERRING PHYSICIAN: Dion Body, MD  CHIEF COMPLAINT:  Chief Complaint  Patient presents with   Cancer    Initial consultation    DIAGNOSIS: The encounter diagnosis was Breast cancer metastasized to bone, left ([place]).   PREVIOUS INVESTIGATIONS:  CT scans reviewed Clinical notes reviewed Labs reviewed PET CT scan ordered to be reviewed prior to treatment  HPI: Patient is a 75 year old female with a history of left-sided breast cancer status post lumpectomy and radiation therapy with a recurrence status post bilateral mastectomies now with stage IV breast cancer.  Recently had close displaced subtrochanteric fracture of the right femur secondary to metastatic disease.  She is ambulating with the assistance of a walker at this time.  She still continues to have some pain in her right hip.  No other symptoms of bone pain.  She is scheduled for a PET CT scan next week for evaluation of burden of disease.  I have asked to evaluate her for possible palliative radiation therapy to her right hip.  PLANNED TREATMENT REGIMEN: Palliative radiation therapy to right hip  PAST MEDICAL HISTORY:  has a past medical history of Arthritis, Breast CA ([place]), Hyperlipidemia, Hypertension, and PONV (postoperative nausea and vomiting).    PAST SURGICAL HISTORY:  Past Surgical History:  Procedure Laterality Date   ABDOMINAL HYSTERECTOMY     BREAST LUMPECTOMY     BREAST RECONSTRUCTION Bilateral    BUNIONECTOMY Right    CATARACT EXTRACTION W/PHACO Left 01/14/2020   Procedure: CATARACT EXTRACTION PHACO AND INTRAOCULAR LENS PLACEMENT ([place]) LEFT;  Surgeon: Eulogio Bear, MD;  Location: [facility];  Service: Ophthalmology;  Laterality: Left;   6.92 0:54.0   CATARACT EXTRACTION W/PHACO Right 02/11/2020   Procedure: CATARACT EXTRACTION PHACO AND INTRAOCULAR LENS PLACEMENT ([facility]) RIGHT 13.20 01:13.6;  Surgeon: Eulogio Bear, MD;  Location: [facility];  Service: Ophthalmology;  Laterality: Right;   INTRAMEDULLARY (IM) NAIL INTERTROCHANTERIC Right 08/28/2020   Procedure: INTRAMEDULLARY (IM) NAIL INTERTROCHANTRIC;  Surgeon: Hessie Knows, MD;  Location: [facility];  Service: Orthopedics;  Laterality: Right;   MASTECTOMY Bilateral     FAMILY HISTORY: family history includes Hypertension in her mother.  SOCIAL HISTORY:  reports that she has never smoked. She has never used smokeless tobacco. She reports previous alcohol use.  ALLERGIES: Codeine, Scallops [shellfish allergy], Vicodin hp [hydrocodone-acetaminophen], Other, and Red dye  MEDICATIONS:  Current Outpatient Medications  Medication Sig Dispense Refill   acetaminophen (TYLENOL) 500 MG tablet Take 500 mg by mouth 2 (two) times daily as needed.     calcium citrate-vitamin D (CITRACAL+D) 315-200 MG-UNIT tablet Take 1 tablet by mouth 2 (two) times daily.     ibuprofen (ADVIL) 200 MG tablet Take 600 mg by mouth every 6 (six) hours as needed for moderate pain.     simvastatin (ZOCOR) 40 MG tablet Take 40 mg by mouth daily.     traMADol (ULTRAM) 50 MG tablet Take 1 tablet (50 mg total) by mouth every 6 (six) hours as needed for moderate pain. 30 tablet 0   vitamin B-12 (CYANOCOBALAMIN) 500 MCG tablet Take 500 mcg by mouth daily.     Vitamin D3 (VITAMIN D) 25 MCG tablet Take 1,000 Units by mouth daily.  GLUCOSAMINE-CHONDROITIN PO Take by mouth daily. [place] Tamaflex (Patient not taking: No sig reported)     hydrochlorothiazide (HYDRODIURIL) 25 MG tablet Take 25 mg by mouth daily. (Patient not taking: No sig reported)     lisinopril (ZESTRIL) 20 MG tablet Take 20 mg by mouth daily. (Patient not taking: Reported on 09/17/2020)     No current facility-administered medications for  this encounter.    ECOG PERFORMANCE STATUS:  2 - Symptomatic, <50% confined to bed  REVIEW OF SYSTEMS: Patient denies any weight loss, fatigue, weakness, fever, chills or night sweats. Patient denies any loss of vision, blurred vision. Patient denies any ringing  of the ears or hearing loss. No irregular heartbeat. Patient denies heart murmur or history of fainting. Patient denies any chest pain or pain radiating to her upper extremities. Patient denies any shortness of breath, difficulty breathing at night, cough or hemoptysis. Patient denies any swelling in the lower legs. Patient denies any nausea vomiting, vomiting of blood, or coffee ground material in the vomitus. Patient denies any stomach pain. Patient states has had normal bowel movements no significant constipation or diarrhea. Patient denies any dysuria, hematuria or significant nocturia. Patient denies any problems walking, swelling in the joints or loss of balance. Patient denies any skin changes, loss of hair or loss of weight. Patient denies any excessive worrying or anxiety or significant depression. Patient denies any problems with insomnia. Patient denies excessive thirst, polyuria, polydipsia. Patient denies any swollen glands, patient denies easy bruising or easy bleeding. Patient denies any recent infections, allergies or URI. Patient "s visual fields have not changed significantly in recent time.   PHYSICAL EXAM: BP 135/81   Pulse 77   Temp 97.7 F (36.5 C) (Tympanic)   Resp 18   Wt 216 lb 12.8 oz (98.3 kg)   BMI 36.08 kg/m  Range of motion of the right lower extremity does not elicit pain.  Motor or sensory in detail levels are equal and symmetric in lower extremities.  Proprioception is intact.  Well-developed well-nourished patient in NAD. HEENT reveals PERLA, EOMI, discs not visualized.  Oral cavity is clear. No oral mucosal lesions are identified. Neck is clear without evidence of cervical or supraclavicular adenopathy.  Lungs are clear to A&P. Cardiac examination is essentially unremarkable with regular rate and rhythm without murmur rub or thrill. Abdomen is benign with no organomegaly or masses noted. Motor sensory and DTR levels are equal and symmetric in the upper and lower extremities. Cranial nerves II through XII are grossly intact. Proprioception is intact. No peripheral adenopathy or edema is identified. No motor or sensory levels are noted. Crude visual fields are within normal range.  LABORATORY DATA: Labs reviewed    RADIOLOGY RESULTS: CT scans reviewed PET CT scans ordered and will be reviewed prior to initiating treatment   IMPRESSION: Stage IV breast cancer in 75 year old female with fracture of her right femur secondary metastatic disease for palliative radiation therapy  PLAN: This time would like to go with palliative radiation therapy to her right hip.  I like to see the PET/CT scan findings prior to initiating treatment since would encompass any other areas in close proximity to her right hip for palliative treatment.  We will plan on delivering 30 Gray in 10 fractions.  Risks and benefits of treatment including skin reaction fatigue possible alteration of blood counts all were discussed in detail with the patient.  She seems to comprehend our treatment plan well.  I have personally set up and  ordered CT simulation after completion of her PET scan.  Patient and daughter both comprehend my treatment plan well.  I would like to take this opportunity to thank you for allowing me to participate in the care of your patient.Noreene Filbert, MD

## 2020-09-18 ENCOUNTER — Encounter: Payer: Self-pay | Admitting: Internal Medicine

## 2020-09-18 MED ORDER — ANASTROZOLE 1 MG PO TABS: 1.0000 mg | ORAL_TABLET | Freq: Every day | ORAL | 1 refills | Status: DC

## 2020-09-19 ENCOUNTER — Encounter: Payer: Self-pay | Admitting: Internal Medicine

## 2020-09-19 ENCOUNTER — Telehealth: Payer: Self-pay | Admitting: Internal Medicine

## 2020-09-19 NOTE — Telephone Encounter (Signed)
On 6/17-spoke to patient regarding the prescription for anastrozole.  Again reviewed the potential side effects/but benefits outweigh the risk.  We will add palbociclib post radiation.  Follow-up as planned

## 2020-09-25 ENCOUNTER — Ambulatory Visit
Admission: RE | Admit: 2020-09-25 | Discharge: 2020-09-25 | Disposition: A | Discharge status: Home / Self Care | Payer: Medicare HMO | Source: Ambulatory Visit | Attending: Internal Medicine | Admitting: Internal Medicine

## 2020-09-25 ENCOUNTER — Other Ambulatory Visit: Payer: Self-pay

## 2020-09-25 DIAGNOSIS — K449 Diaphragmatic hernia without obstruction or gangrene: Secondary | ICD-10-CM | POA: Insufficient documentation

## 2020-09-25 DIAGNOSIS — R918 Other nonspecific abnormal finding of lung field: Secondary | ICD-10-CM | POA: Insufficient documentation

## 2020-09-25 DIAGNOSIS — C7951 Secondary malignant neoplasm of bone: Secondary | ICD-10-CM | POA: Diagnosis not present

## 2020-09-25 DIAGNOSIS — C50912 Malignant neoplasm of unspecified site of left female breast: Secondary | ICD-10-CM | POA: Diagnosis not present

## 2020-09-25 DIAGNOSIS — I2510 Atherosclerotic heart disease of native coronary artery without angina pectoris: Secondary | ICD-10-CM | POA: Diagnosis not present

## 2020-09-25 DIAGNOSIS — I700 Atherosclerosis of aorta: Secondary | ICD-10-CM | POA: Diagnosis not present

## 2020-09-25 LAB — GLUCOSE, CAPILLARY: Glucose-Capillary: 110 mg/dL — ABNORMAL HIGH (ref 70–99)

## 2020-09-25 MED ORDER — FLUDEOXYGLUCOSE F - 18 (FDG) INJECTION
11.2000 | Freq: Once | INTRAVENOUS | Status: AC | PRN
  Administered 2020-09-25: 11.95 via INTRAVENOUS

## 2020-09-29 ENCOUNTER — Ambulatory Visit
Admission: RE | Admit: 2020-09-29 | Discharge: 2020-09-29 | Disposition: A | Discharge status: Home / Self Care | Payer: Medicare HMO | Source: Ambulatory Visit | Attending: Radiation Oncology | Admitting: Radiation Oncology

## 2020-09-29 DIAGNOSIS — Z9013 Acquired absence of bilateral breasts and nipples: Secondary | ICD-10-CM | POA: Diagnosis not present

## 2020-09-29 DIAGNOSIS — C50912 Malignant neoplasm of unspecified site of left female breast: Secondary | ICD-10-CM | POA: Insufficient documentation

## 2020-09-29 DIAGNOSIS — C7951 Secondary malignant neoplasm of bone: Secondary | ICD-10-CM | POA: Insufficient documentation

## 2020-09-30 ENCOUNTER — Encounter: Payer: Self-pay | Admitting: Internal Medicine

## 2020-09-30 ENCOUNTER — Telehealth: Payer: Self-pay | Admitting: Internal Medicine

## 2020-09-30 DIAGNOSIS — R110 Nausea: Secondary | ICD-10-CM

## 2020-09-30 DIAGNOSIS — C50912 Malignant neoplasm of unspecified site of left female breast: Secondary | ICD-10-CM

## 2020-09-30 DIAGNOSIS — C50919 Malignant neoplasm of unspecified site of unspecified female breast: Secondary | ICD-10-CM

## 2020-09-30 MED ORDER — ONDANSETRON HCL 8 MG PO TABS: 8.0000 mg | ORAL_TABLET | Freq: Three times a day (TID) | ORAL | 0 refills | Status: AC | PRN

## 2020-09-30 NOTE — Telephone Encounter (Signed)
On 6/28- called unable to accept patient; left voicemail regarding the results of the PET scan.  Continue anastrozole for now.  We will plan to start CDK-inhibitor after radiation.  Keep appointment as planned next week.Marland Kitchen   GB

## 2020-09-30 NOTE — Addendum Note (Signed)
Addended by: Gloris Ham on: 09/30/2020 03:53 PM   Modules accepted: Orders

## 2020-09-30 NOTE — Telephone Encounter (Signed)
Spoke with patient per Dr. Jacinto Reap -md asked RN to call the patient.   Melanie Malone please -ask patient to hold anastrozole for 2 days; and you can send a prescription for Zofran 8 mg every 8 hours #30. If the nausea improves on Zofran she can get started back on anastrozole. Keep appointment as plannednext week

## 2020-09-30 NOTE — Telephone Encounter (Signed)
Spoke with patient. She will hold the anastrozole x 2 days to see if symptoms improve. Rx will be sent for zofran should the patient need antiemetics to have on hand. Pt will send mychart msg in a few days to let our office know if the symptoms resolve by holding the anastrozole.

## 2020-10-01 ENCOUNTER — Telehealth: Payer: Self-pay | Admitting: Internal Medicine

## 2020-10-01 NOTE — Telephone Encounter (Signed)
On 6/28-spoke with patient regarding results of the PET scan.  As expected metastatic lesions to the bone/also mild mediastinal adenopathy.   Given the nausea to anastrozole; recommend holding for 2 days; use Zofran.  If improved restart back anastrozole.  Otherwise follow-up as planned next week.   GB

## 2020-10-02 ENCOUNTER — Ambulatory Visit: Admission: RE | Admit: 2020-10-02 | Payer: Medicare HMO | Source: Ambulatory Visit

## 2020-10-02 DIAGNOSIS — C7951 Secondary malignant neoplasm of bone: Secondary | ICD-10-CM | POA: Diagnosis not present

## 2020-10-07 ENCOUNTER — Ambulatory Visit
Admission: RE | Admit: 2020-10-07 | Discharge: 2020-10-07 | Disposition: A | Discharge status: Home / Self Care | Payer: Medicare HMO | Attending: Radiation Oncology | Admitting: Radiation Oncology

## 2020-10-07 ENCOUNTER — Ambulatory Visit
Admission: RE | Admit: 2020-10-07 | Discharge: 2020-10-07 | Disposition: A | Discharge status: Home / Self Care | Payer: Medicare HMO | Source: Ambulatory Visit | Attending: Radiation Oncology | Admitting: Radiation Oncology

## 2020-10-07 ENCOUNTER — Other Ambulatory Visit: Payer: Self-pay | Admitting: *Deleted

## 2020-10-07 DIAGNOSIS — C50912 Malignant neoplasm of unspecified site of left female breast: Secondary | ICD-10-CM | POA: Insufficient documentation

## 2020-10-07 DIAGNOSIS — R0602 Shortness of breath: Secondary | ICD-10-CM | POA: Diagnosis not present

## 2020-10-07 DIAGNOSIS — C7951 Secondary malignant neoplasm of bone: Secondary | ICD-10-CM | POA: Insufficient documentation

## 2020-10-07 DIAGNOSIS — Z9013 Acquired absence of bilateral breasts and nipples: Secondary | ICD-10-CM | POA: Diagnosis not present

## 2020-10-08 ENCOUNTER — Inpatient Hospital Stay: Payer: Medicare HMO | Attending: Hospice and Palliative Medicine | Admitting: Hospice and Palliative Medicine

## 2020-10-08 ENCOUNTER — Other Ambulatory Visit: Payer: Self-pay

## 2020-10-08 ENCOUNTER — Ambulatory Visit
Admission: RE | Admit: 2020-10-08 | Discharge: 2020-10-08 | Disposition: A | Discharge status: Home / Self Care | Payer: Medicare HMO | Source: Ambulatory Visit | Attending: Radiation Oncology | Admitting: Radiation Oncology

## 2020-10-08 ENCOUNTER — Encounter: Payer: Self-pay | Admitting: Hospice and Palliative Medicine

## 2020-10-08 VITALS — HR 88 | Temp 97.3°F | Resp 20 | Wt 205.4 lb

## 2020-10-08 DIAGNOSIS — C50912 Malignant neoplasm of unspecified site of left female breast: Secondary | ICD-10-CM | POA: Diagnosis not present

## 2020-10-08 DIAGNOSIS — J189 Pneumonia, unspecified organism: Secondary | ICD-10-CM | POA: Insufficient documentation

## 2020-10-08 DIAGNOSIS — Z79899 Other long term (current) drug therapy: Secondary | ICD-10-CM | POA: Insufficient documentation

## 2020-10-08 DIAGNOSIS — Z923 Personal history of irradiation: Secondary | ICD-10-CM | POA: Diagnosis not present

## 2020-10-08 DIAGNOSIS — R0781 Pleurodynia: Secondary | ICD-10-CM | POA: Diagnosis not present

## 2020-10-08 DIAGNOSIS — Z170 Estrogen receptor positive status [ER+]: Secondary | ICD-10-CM | POA: Insufficient documentation

## 2020-10-08 DIAGNOSIS — C7951 Secondary malignant neoplasm of bone: Secondary | ICD-10-CM | POA: Insufficient documentation

## 2020-10-08 DIAGNOSIS — R059 Cough, unspecified: Secondary | ICD-10-CM | POA: Insufficient documentation

## 2020-10-08 MED ORDER — PREDNISONE 10 MG (21) PO TBPK: ORAL_TABLET | ORAL | 0 refills | Status: DC

## 2020-10-08 MED ORDER — AMOXICILLIN-POT CLAVULANATE 875-125 MG PO TABS: 1.0000 | ORAL_TABLET | Freq: Two times a day (BID) | ORAL | 0 refills | Status: DC

## 2020-10-08 NOTE — Progress Notes (Signed)
Patient here today for [facility] visit regarding shortness of breath and cough. Patient had chest xray yesterday ordered by Dr. Massie Maroon. Patient denies fevers at home, once temp was 100.0 but she has felt feverish at times.

## 2020-10-08 NOTE — Progress Notes (Signed)
Symptom Management [facility]  [phone] [phone]  Patient Care Team: Dion Body, MD as PCP - General (Family Medicine)   Name of the patient: Melanie Malone  [id number]  May 10, 1945   Date of visit: 10/08/20  Reason for Consult: Ms. Chelby Salata is a 75 year old woman with multiple medical problems including recurrent stage IV breast cancer metastatic to bone with right hip pathologic fracture on XRT.  Patient is also on treatment with aromatase inhibitor.  She was seen by Dr. Baruch Gouty with complaint of 3 days of nonproductive cough and shortness of breath.  Patient was sent for chest x-ray which was concerning for left lobe pneumonia.  Patient was subsequently sent to [facility] for management.  Patient says that she developed cough and shortness of breath on 10/05/2020.  She had subjective fever the initial night but has not had fever or chills since.  She has had occasional dry cough, which has responded well to cough drops.  She endorses shortness of breath with exertion but none at rest.  She has been monitoring her pulse oximetry at home and denies hypoxia.  She reports occasional wheezing at night.  No sinus congestion, rhinorrhea, or sore throat.  Denies any neurologic complaints. Denies recent fevers or illnesses. Denies any easy bleeding or bruising. Reports poor appetite but denies weight loss. Denies chest pain. Denies any nausea, vomiting, constipation, or diarrhea. Denies urinary complaints. Patient offers no further specific complaints today.  PAST MEDICAL HISTORY: Past Medical History:  Diagnosis Date   Arthritis    knees, lower back   Breast CA ([place])    Hyperlipidemia    Hypertension    PONV (postoperative nausea and vomiting)     PAST SURGICAL HISTORY:  Past Surgical History:  Procedure Laterality Date   ABDOMINAL HYSTERECTOMY     BREAST LUMPECTOMY     BREAST RECONSTRUCTION Bilateral    BUNIONECTOMY Right     CATARACT EXTRACTION W/PHACO Left 01/14/2020   Procedure: CATARACT EXTRACTION PHACO AND INTRAOCULAR LENS PLACEMENT ([place]) LEFT;  Surgeon: Eulogio Bear, MD;  Location: [facility];  Service: Ophthalmology;  Laterality: Left;  6.92 0:54.0   CATARACT EXTRACTION W/PHACO Right 02/11/2020   Procedure: CATARACT EXTRACTION PHACO AND INTRAOCULAR LENS PLACEMENT ([facility]) RIGHT 13.20 01:13.6;  Surgeon: Eulogio Bear, MD;  Location: [facility];  Service: Ophthalmology;  Laterality: Right;   INTRAMEDULLARY (IM) NAIL INTERTROCHANTERIC Right 08/28/2020   Procedure: INTRAMEDULLARY (IM) NAIL INTERTROCHANTRIC;  Surgeon: Hessie Knows, MD;  Location: [facility];  Service: Orthopedics;  Laterality: Right;   MASTECTOMY Bilateral     HEMATOLOGY/ONCOLOGY HISTORY:  Oncology History Overview Note  # history of breast cancer on the left side in 2003-s/p lumpectomy followed by chemotherapy and radiation.  As per patient reports-stage II [approximate 2 cm primary tumor; 21 lymph nodes negative; reports receiving "red chemo".  Unaware if HER2 positive] as per patient she was started on tamoxifen-.  1 year because of intolerance.  Patient states to have lump in the same breast which was again concerning for malignancy but subsequently proven benign.  However patient underwent bilateral prophylactic mastectomy with reconstruction.  Patient's oncologist was Dr. Anastasia Pall in [place], [place].   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 > 90%/PR-NEG HER2 +1/NEGATIVE  # NGS/MOLECULAR TESTS: PIC3K*    # PALLIATIVE CARE EVALUATION:  # PAIN MANAGEMENT:    DIAGNOSIS: RECURRENT BREAST CANCER  STAGE:  IV       ;  GOALS: palliative care  CURRENT/MOST RECENT THERAPY : Anastrazole    Breast cancer metastasized to bone, left ([place])  09/12/2020 Initial Diagnosis   Breast cancer metastasized to bone, left ([place])   09/18/2020  Cancer Staging   Staging form: Breast, AJCC 8th Edition - Clinical: Stage IV (cTX, cNX, cM1, GX, ER+, PR-, HER2-) - Signed by Cammie Sickle, MD on 09/18/2020  Histologic grading system: 3 grade system      ALLERGIES:  is allergic to codeine, scallops [shellfish allergy], vicodin hp [hydrocodone-acetaminophen], other, and red dye.  MEDICATIONS:  Current Outpatient Medications  Medication Sig Dispense Refill   acetaminophen (TYLENOL) 500 MG tablet Take 500 mg by mouth 2 (two) times daily as needed.     calcium citrate-vitamin D (CITRACAL+D) 315-200 MG-UNIT tablet Take 1 tablet by mouth 2 (two) times daily.     ondansetron (ZOFRAN) 8 MG tablet Take 1 tablet (8 mg total) by mouth every 8 (eight) hours as needed for nausea or vomiting. 30 tablet 0   simvastatin (ZOCOR) 40 MG tablet Take 40 mg by mouth daily.     traMADol (ULTRAM) 50 MG tablet Take 1 tablet (50 mg total) by mouth every 6 (six) hours as needed for moderate pain. 30 tablet 0   vitamin B-12 (CYANOCOBALAMIN) 500 MCG tablet Take 500 mcg by mouth daily.     Vitamin D3 (VITAMIN D) 25 MCG tablet Take 1,000 Units by mouth daily.     anastrozole (ARIMIDEX) 1 MG tablet Take 1 tablet (1 mg total) by mouth daily. (Patient not taking: Reported on 10/08/2020) 90 tablet 1   GLUCOSAMINE-CHONDROITIN PO Take by mouth daily. [place] Tamaflex (Patient not taking: No sig reported)     hydrochlorothiazide (HYDRODIURIL) 25 MG tablet Take 25 mg by mouth daily. (Patient not taking: No sig reported)     ibuprofen (ADVIL) 200 MG tablet Take 600 mg by mouth every 6 (six) hours as needed for moderate pain. (Patient not taking: Reported on 10/08/2020)     lisinopril (ZESTRIL) 20 MG tablet Take 20 mg by mouth daily. (Patient not taking: No sig reported)     No current facility-administered medications for this visit.    VITAL SIGNS: Pulse 88   Temp (!) 97.3 F (36.3 C) (Tympanic)   Resp 20   Wt 205 lb 6.4 oz (93.2 kg)   SpO2 96%   BMI 34.18 kg/m   Filed Weights   10/08/20 1122  Weight: 205 lb 6.4 oz (93.2 kg)    Estimated body mass index is 34.18 kg/m as calculated from the following:   Height as of 08/28/20: '5\' 5"'  (1.651 m).   Weight as of this encounter: 205 lb 6.4 oz (93.2 kg).  LABS: CBC:    Component Value Date/Time   WBC 9.3 09/12/2020 1436   HGB 12.2 09/12/2020 1436   HCT 36.6 09/12/2020 1436   PLT 421 (H) 09/12/2020 1436   MCV 90.6 09/12/2020 1436   NEUTROABS 5.9 09/12/2020 1436   LYMPHSABS 1.9 09/12/2020 1436   MONOABS 1.1 (H) 09/12/2020 1436   EOSABS 0.1 09/12/2020 1436   BASOSABS 0.0 09/12/2020 1436   Comprehensive Metabolic Panel:    Component Value Date/Time   NA 132 (L) 09/12/2020 1436   K 3.8 09/12/2020 1436   CL 94 (L) 09/12/2020 1436   CO2 25 09/12/2020 1436   BUN 15 09/12/2020 1436   CREATININE 0.59 09/12/2020 1436   GLUCOSE 104 (H) 09/12/2020 1436   CALCIUM 9.6 09/12/2020 1436   AST 20  09/12/2020 1436   ALT 26 09/12/2020 1436   ALKPHOS 160 (H) 09/12/2020 1436   BILITOT 0.6 09/12/2020 1436   PROT 7.5 09/12/2020 1436   ALBUMIN 3.9 09/12/2020 1436    RADIOGRAPHIC STUDIES: DG Chest 2 View  Result Date: 10/07/2020 CLINICAL DATA:  Shortness of breath for 3 days. EXAM: CHEST - 2 VIEW COMPARISON:  08/27/2020 FINDINGS: Heart size and pulmonary vascularity are normal. Elevation of the left hemidiaphragm with interval development of consolidation in the left lung base, most likely pneumonia. Right lung is clear. Possible small left pleural effusion. No pneumothorax. Degenerative changes in the spine and shoulders. Bilateral breast implants. IMPRESSION: Consolidation in the left lung base with probable small left pleural effusion. Changes likely to represent pneumonia. Electronically Signed   By: Lucienne Capers M.D.   On: 10/07/2020 23:57   NM PET Image Initial (PI) Skull Base To Thigh  Result Date: 09/26/2020 CLINICAL DATA:  Initial treatment strategy for left breast cancer with bone metastases.  Radiation therapy and chemotherapy to left breast in 2003-2004. Bilateral mastectomy. Recent pathologic subtrochanteric right hip fracture. EXAM: NUCLEAR MEDICINE PET SKULL BASE TO THIGH TECHNIQUE: 12.0 mCi F-18 FDG was injected intravenously. Full-ring PET imaging was performed from the skull base to thigh after the radiotracer. CT data was obtained and used for attenuation correction and anatomic localization. Fasting blood glucose: 110 mg/dl COMPARISON:  08/29/2020 CT abdomen/pelvis.  08/27/2020 chest CT. FINDINGS: Mediastinal blood pool activity: SUV max 3.1 Liver activity: SUV max NA NECK: No hypermetabolic lymph nodes in the neck. Incidental CT findings: Subcentimeter calcified right thyroid nodules. Not clinically significant; no follow-up imaging recommended (ref: J Am Coll Radiol. 2015 Feb;12(2): 143-50). CHEST: Mildly enlarged 1.2 cm right paratracheal node with low level hypermetabolism with max SUV 3.2 (series 3/image 89). Otherwise no enlarged or hypermetabolic axillary, mediastinal or hilar lymph nodes. No hypermetabolic pulmonary findings. Incidental CT findings: Coronary atherosclerosis. Atherosclerotic thoracic aorta with dilated 4.2 cm ascending thoracic aorta. Bilateral mastectomy with bilateral breast reconstruction. Trace dependent bilateral pleural effusions. A few scattered bilateral pulmonary nodules, largest 0.9 cm in the peripheral left lower lobe (series 3/image 111), which demonstrates no significant FDG uptake (max SUV 1.2), all stable since 08/27/2020 chest CT. ABDOMEN/PELVIS: No abnormal hypermetabolic activity within the liver, pancreas, adrenal glands, or spleen. No hypermetabolic lymph nodes in the abdomen or pelvis. Incidental CT findings: Small hiatal hernia. Cholecystectomy. Atherosclerotic nonaneurysmal abdominal aorta. Hysterectomy. SKELETON: Widespread hypermetabolic mixed lytic and sclerotic lesions throughout axial and proximal appendicular skeleton, with representative  lesions as follows: -left posterior acetabular 2.6 cm lesion with max SUV 9.0 (series 3/image 244) -left iliac wing 7.6 cm lesion with max SUV 10.2 (series 3/image 220) -medial right iliac bone 2.1 cm lesion with max SUV 7.2 (series 3/image 212) -sub trochanteric proximal right femur lesion with max SUV 7.1 (likely a combination of malignant and postsurgical uptake) -lateral right second rib 1.7 cm lesion with associated pathologic fracture with max SUV 6.5 (series 3/image 76) -T12 vertebral body lesion with max SUV 4.9 Incidental CT findings: Partially visualized fixation hardware in the proximal right femur with comminuted pathologic subtrochanteric right femur fracture in near-anatomic alignment. IMPRESSION: 1. Widespread hypermetabolic mixed lytic and sclerotic bone metastases throughout the axial and proximal appendicular skeleton, as detailed. 2. Mildly enlarged right paratracheal lymph node with low-level hypermetabolism, indeterminate for nodal metastasis. 3. Scattered subcentimeter bilateral pulmonary nodules, largest 0.9 cm in the peripheral left lower lobe, without significant FDG uptake, most below PET resolution. Attention on follow-up  chest CT suggested in 3-6 months. 4. Chronic findings include: Aortic Atherosclerosis (ICD10-I70.0). Coronary atherosclerosis. Small hiatal hernia. Electronically Signed   By: Ilona Sorrel M.D.   On: 09/26/2020 09:03    PERFORMANCE STATUS (ECOG) : 3 - Symptomatic, >50% confined to bed  Review of Systems Unless otherwise noted, a complete review of systems is negative.  Physical Exam General: NAD Cardiovascular: regular rate and rhythm Pulmonary: clear ant fields Abdomen: soft, nontender, + bowel sounds GU: no suprapubic tenderness Extremities: no edema, no joint deformities Skin: no rashes Neurological: Weakness but otherwise nonfocal  Assessment and Plan- Patient is a 75 y.o. female with metastatic breast cancer on aromatase inhibitor and receiving XRT  who presents to the [facility] after chest x-ray showed probable pneumonia.   Community-acquired pneumonia -we will empirically treat with Augmentin 884m x10 days. Will also start short course of prednisone for wheezing.  We will send patient to receive COVID PCR testing.  Discussed importance of monitoring oximetry at home and utilizing ER if symptoms worsening.  Encourage p.o. fluids.  Patient has follow-up with Dr. BRogue Bussingon 10/10/2020.  Case and plan discussed with Dr. BRogue Bussing  Patient expressed understanding and was in agreement with this plan. She also understands that She can call clinic at any time with any questions, concerns, or complaints.   Thank you for allowing me to participate in the care of this very pleasant patient.   Time Total: 25 minutes  Visit consisted of counseling and education dealing with the complex and emotionally intense issues of symptom management and palliative care in the setting of serious and potentially life-threatening illness.Greater than 50%  of this time was spent counseling and coordinating care related to the above assessment and plan.  Signed by: JAltha Harm PhD, NP-C

## 2020-10-09 ENCOUNTER — Ambulatory Visit
Admission: RE | Admit: 2020-10-09 | Discharge: 2020-10-09 | Disposition: A | Discharge status: Home / Self Care | Payer: Medicare HMO | Source: Ambulatory Visit | Attending: Radiation Oncology | Admitting: Radiation Oncology

## 2020-10-09 DIAGNOSIS — C7951 Secondary malignant neoplasm of bone: Secondary | ICD-10-CM | POA: Diagnosis not present

## 2020-10-10 ENCOUNTER — Inpatient Hospital Stay: Payer: Medicare HMO

## 2020-10-10 ENCOUNTER — Inpatient Hospital Stay (HOSPITAL_BASED_OUTPATIENT_CLINIC_OR_DEPARTMENT_OTHER): Payer: Medicare HMO | Admitting: Internal Medicine

## 2020-10-10 ENCOUNTER — Ambulatory Visit
Admission: RE | Admit: 2020-10-10 | Discharge: 2020-10-10 | Disposition: A | Discharge status: Home / Self Care | Payer: Medicare HMO | Source: Ambulatory Visit | Attending: Radiation Oncology | Admitting: Radiation Oncology

## 2020-10-10 ENCOUNTER — Encounter: Payer: Self-pay | Admitting: Internal Medicine

## 2020-10-10 VITALS — BP 116/81 | HR 80 | Temp 97.9°F | Resp 18 | Ht 65.0 in | Wt 205.8 lb

## 2020-10-10 DIAGNOSIS — J189 Pneumonia, unspecified organism: Secondary | ICD-10-CM | POA: Diagnosis not present

## 2020-10-10 DIAGNOSIS — C7951 Secondary malignant neoplasm of bone: Secondary | ICD-10-CM | POA: Diagnosis not present

## 2020-10-10 DIAGNOSIS — C50912 Malignant neoplasm of unspecified site of left female breast: Secondary | ICD-10-CM

## 2020-10-10 DIAGNOSIS — M899 Disorder of bone, unspecified: Secondary | ICD-10-CM

## 2020-10-10 LAB — CBC WITH DIFFERENTIAL/PLATELET
Abs Immature Granulocytes: 0.31 10*3/uL — ABNORMAL HIGH (ref 0.00–0.07)
Basophils Absolute: 0.1 10*3/uL (ref 0.0–0.1)
Basophils Relative: 0 %
Eosinophils Absolute: 0 10*3/uL (ref 0.0–0.5)
Eosinophils Relative: 0 %
HCT: 38.4 % (ref 36.0–46.0)
Hemoglobin: 12.9 g/dL (ref 12.0–15.0)
Immature Granulocytes: 2 %
Lymphocytes Relative: 6 %
Lymphs Abs: 0.8 10*3/uL (ref 0.7–4.0)
MCH: 29.7 pg (ref 26.0–34.0)
MCHC: 33.6 g/dL (ref 30.0–36.0)
MCV: 88.5 fL (ref 80.0–100.0)
Monocytes Absolute: 0.8 10*3/uL (ref 0.1–1.0)
Monocytes Relative: 6 %
Neutro Abs: 11.2 10*3/uL — ABNORMAL HIGH (ref 1.7–7.7)
Neutrophils Relative %: 86 %
Platelets: 407 10*3/uL — ABNORMAL HIGH (ref 150–400)
RBC: 4.34 MIL/uL (ref 3.87–5.11)
RDW: 13.3 % (ref 11.5–15.5)
WBC: 13.2 10*3/uL — ABNORMAL HIGH (ref 4.0–10.5)
nRBC: 0 % (ref 0.0–0.2)

## 2020-10-10 LAB — COMPREHENSIVE METABOLIC PANEL
ALT: 61 U/L — ABNORMAL HIGH (ref 0–44)
AST: 45 U/L — ABNORMAL HIGH (ref 15–41)
Albumin: 3.5 g/dL (ref 3.5–5.0)
Alkaline Phosphatase: 164 U/L — ABNORMAL HIGH (ref 38–126)
Anion gap: 12 (ref 5–15)
BUN: 16 mg/dL (ref 8–23)
CO2: 24 mmol/L (ref 22–32)
Calcium: 8.8 mg/dL — ABNORMAL LOW (ref 8.9–10.3)
Chloride: 97 mmol/L — ABNORMAL LOW (ref 98–111)
Creatinine, Ser: 0.76 mg/dL (ref 0.44–1.00)
GFR, Estimated: >60 mL/min (ref >60)
Glucose, Bld: 107 mg/dL — ABNORMAL HIGH (ref 70–99)
Potassium: 3.5 mmol/L (ref 3.5–5.1)
Sodium: 133 mmol/L — ABNORMAL LOW (ref 135–145)
Total Bilirubin: 0.6 mg/dL (ref 0.3–1.2)
Total Protein: 7.5 g/dL (ref 6.5–8.1)

## 2020-10-10 MED ORDER — ZOLEDRONIC ACID 4 MG/100ML IV SOLN
4.0000 mg | Freq: Once | INTRAVENOUS | Status: AC
  Administered 2020-10-10: 4 mg via INTRAVENOUS

## 2020-10-10 MED ORDER — SODIUM CHLORIDE 0.9 % IV SOLN
Freq: Once | INTRAVENOUS | Status: AC
  Filled 2020-10-10: qty 250

## 2020-10-10 MED ORDER — EXEMESTANE 25 MG PO TABS: 25.0000 mg | ORAL_TABLET | Freq: Every day | ORAL | 3 refills | Status: AC

## 2020-10-10 NOTE — Progress Notes (Signed)
Still recovering from pneumonia. Has SOB but states she is getting better.

## 2020-10-10 NOTE — Assessment & Plan Note (Addendum)
#  recurrent metastatic breast cancer to the bone-ER/PR-positive; HER2/neu-NEG. July 2022 PET Widespread hypermetabolic mixed lytic and sclerotic bone metastases throughout the axial and proximal appendicular skeleton, Mildly enlarged right paratracheal lymph node with low-level hypermetabolism, indeterminate for nodal metastasis; Scattered subcentimeter bilateral pulmonary nodules, largest 0.9 cm in the peripheral left lower lobe.   #Patient currently on anastrozole-tolerating poorly because of ongoing nausea extreme fatigue.  Discontinue anastrozole.  Recommend starting Aromasin.  Patient will need to be started on CDK inhibitor after resolution of pneumonia [see below]  # Metastatic bone disease/right hip pathologic fracture status post ORIF-recommend Zometa; again reviewed regarding hypercalcemia/ONJ.  Recommend calcium plus vitamin D.  # Pneumonia LLL-July 2022 chest x-ray reviewed on z-pack/ prednisone [no fevers; cough pleuritic ppain]; recommend Robitussin.  # DISPOSITION: # zometa today # will follow up in 2 weeks; MD Labs- cbc/cmp-Dr.B

## 2020-10-10 NOTE — Progress Notes (Signed)
Ca 8.8. Per treatment orders, hold if Ca less than 8.9. Per Jonne Ply., RN, okay to proceed with Zometa today.

## 2020-10-10 NOTE — Progress Notes (Signed)
[facility] CONSULT NOTE  Patient Care Team: Dion Body, MD as PCP - General (Family Medicine)  CHIEF COMPLAINTS/PURPOSE OF CONSULTATION: Breast cancer  #  Oncology History Overview Note  # history of breast cancer on the left side in 2003-s/p lumpectomy followed by chemotherapy and radiation.  As per patient reports-stage II [approximate 2 cm primary tumor; 21 lymph nodes negative; reports receiving "red chemo".  Unaware if HER2 positive] as per patient she was started on tamoxifen-.  1 year because of intolerance.  Patient states to have lump in the same breast which was again concerning for malignancy but subsequently proven benign.  However patient underwent bilateral prophylactic mastectomy with reconstruction.  Patient's oncologist was Dr. Anastasia Pall in [place], [place].   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 > 90%/PR-NEG HER2 +1/NEGATIVE  # NGS/MOLECULAR TESTS: PIC3K*    # PALLIATIVE CARE EVALUATION:  # PAIN MANAGEMENT:    DIAGNOSIS: RECURRENT BREAST CANCER  STAGE:  IV       ;  GOALS: palliative care  CURRENT/MOST RECENT THERAPY : Anastrazole    Breast cancer metastasized to bone, left ([place])  09/12/2020 Initial Diagnosis   Breast cancer metastasized to bone, left ([place])   09/18/2020 Cancer Staging   Staging form: Breast, AJCC 8th Edition - Clinical: Stage IV (cTX, cNX, cM1, GX, ER+, PR-, HER2-) - Signed by Cammie Sickle, MD on 09/18/2020  Histologic grading system: 3 grade system       HISTORY OF PRESENTING ILLNESS:  Melanie Malone 75 y.o.  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  Patient was started on anastrozole approximately 3 weeks ago.  However stopped because of extreme nausea poor tolerance.  In the interim patient was also diagnosed with pneumonia when she  complained of pleuritic posterior chest wall pain.  Chest x-ray suggestive of left lower lobe pneumonia currently on antibiotics.  Slight improvement noted.  Patient has been evaluated by radiation oncology with plan for radiation to the right hip status post ORIF for pathologic fracture   Review of Systems  Constitutional:  Positive for malaise/fatigue. Negative for chills, diaphoresis, fever and weight loss.  HENT:  Negative for nosebleeds and sore throat.   Eyes:  Negative for double vision.  Respiratory:  Negative for cough, hemoptysis, sputum production, shortness of breath and wheezing.   Cardiovascular:  Negative for chest pain, palpitations, orthopnea and leg swelling.  Gastrointestinal:  Negative for abdominal pain, blood in stool, constipation, diarrhea, heartburn, melena, nausea and vomiting.  Genitourinary:  Negative for dysuria, frequency and urgency.  Musculoskeletal:  Positive for joint pain. Negative for back pain.  Skin: Negative.  Negative for itching and rash.  Neurological:  Negative for dizziness, tingling, focal weakness, weakness and headaches.  Endo/Heme/Allergies:  Does not bruise/bleed easily.  Psychiatric/Behavioral:  Negative for depression. The patient is not nervous/anxious and does not have insomnia.     MEDICAL HISTORY:  Past Medical History:  Diagnosis Date  . Arthritis    knees, lower back  . Breast CA ([facility])   . Hyperlipidemia   . Hypertension   . PONV (postoperative nausea and vomiting)     SURGICAL HISTORY: Past Surgical History:  Procedure Laterality Date  . ABDOMINAL HYSTERECTOMY    . BREAST LUMPECTOMY    . BREAST RECONSTRUCTION Bilateral   . BUNIONECTOMY Right   . CATARACT EXTRACTION W/PHACO Left  01/14/2020   Procedure: CATARACT EXTRACTION PHACO AND INTRAOCULAR LENS PLACEMENT ([facility]) LEFT;  Surgeon: Eulogio Bear, MD;  Location: [facility];  Service: Ophthalmology;  Laterality: Left;  6.92 0:54.0  . CATARACT EXTRACTION W/PHACO  Right 02/11/2020   Procedure: CATARACT EXTRACTION PHACO AND INTRAOCULAR LENS PLACEMENT ([facility]) RIGHT 13.20 01:13.6;  Surgeon: Eulogio Bear, MD;  Location: [facility];  Service: Ophthalmology;  Laterality: Right;  . INTRAMEDULLARY (IM) NAIL INTERTROCHANTERIC Right 08/28/2020   Procedure: INTRAMEDULLARY (IM) NAIL INTERTROCHANTRIC;  Surgeon: Hessie Knows, MD;  Location: [facility];  Service: Orthopedics;  Laterality: Right;  . MASTECTOMY Bilateral     SOCIAL HISTORY: Social History   Socioeconomic History  . Marital status: Married    Spouse name: Not on file  . Number of children: Not on file  . Years of education: Not on file  . Highest education level: Not on file  Occupational History  . Not on file  Tobacco Use  . Smoking status: Never  . Smokeless tobacco: Never  Vaping Use  . Vaping Use: Never used  Substance and Sexual Activity  . Alcohol use: Not Currently  . Drug use: Not on file  . Sexual activity: Not on file  Other Topics Concern  . Not on file  Social History Narrative   Patient moved from [place] about 5 years ago to live closer to her husband's family.  Husband with scleroderma/caregiver.  Used to work in [employer] (medical).  No smoking.  No alcohol abuse.  Patient's daughter lives in [place] and [place]   Social Determinants of Health   Financial Resource Strain: Not on file  Food Insecurity: Not on file  Transportation Needs: Not on file  Physical Activity: Not on file  Stress: Not on file  Social Connections: Not on file  Intimate Partner Violence: Not on file    FAMILY HISTORY: Family History  Problem Relation Age of Onset  . Hypertension Mother     ALLERGIES:  is allergic to codeine, scallops [shellfish allergy], vicodin hp [hydrocodone-acetaminophen], other, and red dye.  MEDICATIONS:  Current Outpatient Medications  Medication Sig Dispense Refill  . amoxicillin-clavulanate (AUGMENTIN) 875-125 MG tablet Take 1 tablet by mouth 2 (two) times  daily. 20 tablet 0  . calcium citrate-vitamin D (CITRACAL+D) 315-200 MG-UNIT tablet Take 1 tablet by mouth 2 (two) times daily.    Marland Kitchen exemestane (AROMASIN) 25 MG tablet Take 1 tablet (25 mg total) by mouth daily after breakfast. 30 tablet 3  . hydrochlorothiazide (HYDRODIURIL) 25 MG tablet Take 25 mg by mouth daily.    Marland Kitchen lisinopril (ZESTRIL) 20 MG tablet Take 20 mg by mouth daily.    . ondansetron (ZOFRAN) 8 MG tablet Take 1 tablet (8 mg total) by mouth every 8 (eight) hours as needed for nausea or vomiting. 30 tablet 0  . predniSONE (STERAPRED UNI-PAK 21 TAB) 10 MG (21) TBPK tablet Take 4 tablets ($RemoveBe'40mg'iSVBpIvIf$ ) x 2 days, then take 3 tablets ($RemoveBe'30mg'TgJYnXrkM$ ) x 2 days, then take 2 tablets ($RemoveBe'20mg'MspBiTVfp$ ) x 2 days, then take 1 tablet ($RemoveB'10mg'NHbxWvIo$ ) x 3 days, then stop 21 tablet 0  . simvastatin (ZOCOR) 40 MG tablet Take 40 mg by mouth daily.    . traMADol (ULTRAM) 50 MG tablet Take 1 tablet (50 mg total) by mouth every 6 (six) hours as needed for moderate pain. 30 tablet 0  . vitamin B-12 (CYANOCOBALAMIN) 500 MCG tablet Take 500 mcg by mouth daily.    . Vitamin D3 (VITAMIN D) 25 MCG tablet Take 1,000 Units  by mouth daily.    Marland Kitchen acetaminophen (TYLENOL) 500 MG tablet Take 500 mg by mouth 2 (two) times daily as needed. (Patient not taking: Reported on 10/10/2020)    . GLUCOSAMINE-CHONDROITIN PO Take by mouth daily. [place] Tamaflex (Patient not taking: No sig reported)    . ibuprofen (ADVIL) 200 MG tablet Take 600 mg by mouth every 6 (six) hours as needed for moderate pain. (Patient not taking: Reported on 10/10/2020)     No current facility-administered medications for this visit.      Marland Kitchen  PHYSICAL EXAMINATION: ECOG PERFORMANCE STATUS: 1 - Symptomatic but completely ambulatory  Vitals:   10/10/20 1051  BP: 116/81  Pulse: 80  Resp: 18  Temp: 97.9 F (36.6 C)  SpO2: 95%   Filed Weights   10/10/20 1051  Weight: 205 lb 12.8 oz (93.4 kg)    Physical Exam Vitals and nursing note reviewed.  Constitutional:      Comments:  Ambulating: assistive devices [recent hip surgery]  Accompanied: Daughter  HENT:     Head: Normocephalic and atraumatic.     Mouth/Throat:     Pharynx: Oropharynx is clear.  Eyes:     Extraocular Movements: Extraocular movements intact.     Pupils: Pupils are equal, round, and reactive to light.  Cardiovascular:     Rate and Rhythm: Normal rate and regular rhythm.  Pulmonary:     Effort: Pulmonary effort is normal.     Breath sounds: Normal breath sounds.  Abdominal:     Palpations: Abdomen is soft.  Musculoskeletal:        General: Normal range of motion.     Cervical back: Normal range of motion.  Skin:    General: Skin is warm.  Neurological:     General: No focal deficit present.     Mental Status: She is alert and oriented to person, place, and time.  Psychiatric:        Behavior: Behavior normal.        Judgment: Judgment normal.     LABORATORY DATA:  I have reviewed the data as listed Lab Results  Component Value Date   WBC 13.2 (H) 10/10/2020   HGB 12.9 10/10/2020   HCT 38.4 10/10/2020   MCV 88.5 10/10/2020   PLT 407 (H) 10/10/2020   Recent Labs    09/02/20 0507 09/12/20 1436 10/10/20 1016  NA 135 132* 133*  K 3.5 3.8 3.5  CL 101 94* 97*  CO2 $Re'24 25 24  'yxL$ GLUCOSE 111* 104* 107*  BUN $Re'14 15 16  'LAZ$ CREATININE 0.48 0.59 0.76  CALCIUM 8.8* 9.6 8.8*  GFRNONAA >60 >60 >60  PROT  --  7.5 7.5  ALBUMIN  --  3.9 3.5  AST  --  20 45*  ALT  --  26 61*  ALKPHOS  --  160* 164*  BILITOT  --  0.6 0.6    RADIOGRAPHIC STUDIES: I have personally reviewed the radiological images as listed and agreed with the findings in the report. DG Chest 2 View  Result Date: 10/07/2020 CLINICAL DATA:  Shortness of breath for 3 days. EXAM: CHEST - 2 VIEW COMPARISON:  08/27/2020 FINDINGS: Heart size and pulmonary vascularity are normal. Elevation of the left hemidiaphragm with interval development of consolidation in the left lung base, most likely pneumonia. Right lung is clear.  Possible small left pleural effusion. No pneumothorax. Degenerative changes in the spine and shoulders. Bilateral breast implants. IMPRESSION: Consolidation in the left lung base with probable small left pleural  effusion. Changes likely to represent pneumonia. Electronically Signed   By: Burman Nieves M.D.   On: 10/07/2020 23:57   NM PET Image Initial (PI) Skull Base To Thigh  Result Date: 09/26/2020 CLINICAL DATA:  Initial treatment strategy for left breast cancer with bone metastases. Radiation therapy and chemotherapy to left breast in 2003-2004. Bilateral mastectomy. Recent pathologic subtrochanteric right hip fracture. EXAM: NUCLEAR MEDICINE PET SKULL BASE TO THIGH TECHNIQUE: 12.0 mCi F-18 FDG was injected intravenously. Full-ring PET imaging was performed from the skull base to thigh after the radiotracer. CT data was obtained and used for attenuation correction and anatomic localization. Fasting blood glucose: 110 mg/dl COMPARISON:  29/71/0246 CT abdomen/pelvis.  08/27/2020 chest CT. FINDINGS: Mediastinal blood pool activity: SUV max 3.1 Liver activity: SUV max NA NECK: No hypermetabolic lymph nodes in the neck. Incidental CT findings: Subcentimeter calcified right thyroid nodules. Not clinically significant; no follow-up imaging recommended (ref: J Am Coll Radiol. 2015 Feb;12(2): 143-50). CHEST: Mildly enlarged 1.2 cm right paratracheal node with low level hypermetabolism with max SUV 3.2 (series 3/image 89). Otherwise no enlarged or hypermetabolic axillary, mediastinal or hilar lymph nodes. No hypermetabolic pulmonary findings. Incidental CT findings: Coronary atherosclerosis. Atherosclerotic thoracic aorta with dilated 4.2 cm ascending thoracic aorta. Bilateral mastectomy with bilateral breast reconstruction. Trace dependent bilateral pleural effusions. A few scattered bilateral pulmonary nodules, largest 0.9 cm in the peripheral left lower lobe (series 3/image 111), which demonstrates no  significant FDG uptake (max SUV 1.2), all stable since 08/27/2020 chest CT. ABDOMEN/PELVIS: No abnormal hypermetabolic activity within the liver, pancreas, adrenal glands, or spleen. No hypermetabolic lymph nodes in the abdomen or pelvis. Incidental CT findings: Small hiatal hernia. Cholecystectomy. Atherosclerotic nonaneurysmal abdominal aorta. Hysterectomy. SKELETON: Widespread hypermetabolic mixed lytic and sclerotic lesions throughout axial and proximal appendicular skeleton, with representative lesions as follows: -left posterior acetabular 2.6 cm lesion with max SUV 9.0 (series 3/image 244) -left iliac wing 7.6 cm lesion with max SUV 10.2 (series 3/image 220) -medial right iliac bone 2.1 cm lesion with max SUV 7.2 (series 3/image 212) -sub trochanteric proximal right femur lesion with max SUV 7.1 (likely a combination of malignant and postsurgical uptake) -lateral right second rib 1.7 cm lesion with associated pathologic fracture with max SUV 6.5 (series 3/image 76) -T12 vertebral body lesion with max SUV 4.9 Incidental CT findings: Partially visualized fixation hardware in the proximal right femur with comminuted pathologic subtrochanteric right femur fracture in near-anatomic alignment. IMPRESSION: 1. Widespread hypermetabolic mixed lytic and sclerotic bone metastases throughout the axial and proximal appendicular skeleton, as detailed. 2. Mildly enlarged right paratracheal lymph node with low-level hypermetabolism, indeterminate for nodal metastasis. 3. Scattered subcentimeter bilateral pulmonary nodules, largest 0.9 cm in the peripheral left lower lobe, without significant FDG uptake, most below PET resolution. Attention on follow-up chest CT suggested in 3-6 months. 4. Chronic findings include: Aortic Atherosclerosis (ICD10-I70.0). Coronary atherosclerosis. Small hiatal hernia. Electronically Signed   By: Delbert Phenix M.D.   On: 09/26/2020 09:03    ASSESSMENT & PLAN:   Breast cancer metastasized to  bone, left ([facility]) # recurrent metastatic breast cancer to the bone-ER/PR-positive; HER2/neu-NEG. July 2022 PET Widespread hypermetabolic mixed lytic and sclerotic bone metastases throughout the axial and proximal appendicular skeleton, Mildly enlarged right paratracheal lymph node with low-level hypermetabolism, indeterminate for nodal metastasis; Scattered subcentimeter bilateral pulmonary nodules, largest 0.9 cm in the peripheral left lower lobe.   #Patient currently on anastrozole-tolerating poorly because of ongoing nausea extreme fatigue.  Discontinue anastrozole.  Recommend starting Aromasin.  Patient will need to be started on CDK inhibitor after resolution of pneumonia [see below]  # Metastatic bone disease/right hip pathologic fracture status post ORIF-recommend Zometa; again reviewed regarding hypercalcemia/ONJ.  Recommend calcium plus vitamin D.  # Pneumonia LLL-July 2022 chest x-ray reviewed on z-pack/ prednisone [no fevers; cough pleuritic ppain]; recommend Robitussin.  # DISPOSITION: # zometa today # will follow up in 2 weeks; MD Labs- cbc/cmp-Dr.B    All questions were answered. The patient knows to call the clinic with any problems, questions or concerns.       Cammie Sickle, MD 10/12/2020 9:28 PM

## 2020-10-11 LAB — CANCER ANTIGEN 27.29: CA 27.29: 134.6 U/mL — ABNORMAL HIGH (ref 0.0–38.6)

## 2020-10-12 ENCOUNTER — Encounter: Payer: Self-pay | Admitting: Internal Medicine

## 2020-10-13 ENCOUNTER — Ambulatory Visit
Admission: RE | Admit: 2020-10-13 | Discharge: 2020-10-13 | Disposition: A | Discharge status: Home / Self Care | Payer: Medicare HMO | Source: Ambulatory Visit | Attending: Radiation Oncology | Admitting: Radiation Oncology

## 2020-10-13 DIAGNOSIS — C7951 Secondary malignant neoplasm of bone: Secondary | ICD-10-CM | POA: Diagnosis not present

## 2020-10-14 ENCOUNTER — Ambulatory Visit
Admission: RE | Admit: 2020-10-14 | Discharge: 2020-10-14 | Disposition: A | Discharge status: Home / Self Care | Payer: Medicare HMO | Source: Ambulatory Visit | Attending: Radiation Oncology | Admitting: Radiation Oncology

## 2020-10-14 DIAGNOSIS — C7951 Secondary malignant neoplasm of bone: Secondary | ICD-10-CM | POA: Diagnosis not present

## 2020-10-15 ENCOUNTER — Ambulatory Visit
Admission: RE | Admit: 2020-10-15 | Discharge: 2020-10-15 | Disposition: A | Discharge status: Home / Self Care | Payer: Medicare HMO | Source: Ambulatory Visit | Attending: Radiation Oncology | Admitting: Radiation Oncology

## 2020-10-15 DIAGNOSIS — C7951 Secondary malignant neoplasm of bone: Secondary | ICD-10-CM | POA: Diagnosis not present

## 2020-10-16 ENCOUNTER — Ambulatory Visit
Admission: RE | Admit: 2020-10-16 | Discharge: 2020-10-16 | Disposition: A | Discharge status: Home / Self Care | Payer: Medicare HMO | Source: Ambulatory Visit | Attending: Radiation Oncology | Admitting: Radiation Oncology

## 2020-10-16 DIAGNOSIS — C7951 Secondary malignant neoplasm of bone: Secondary | ICD-10-CM | POA: Diagnosis not present

## 2020-10-17 ENCOUNTER — Ambulatory Visit
Admission: RE | Admit: 2020-10-17 | Discharge: 2020-10-17 | Disposition: A | Discharge status: Home / Self Care | Payer: Medicare HMO | Source: Ambulatory Visit | Attending: Radiation Oncology | Admitting: Radiation Oncology

## 2020-10-17 DIAGNOSIS — C7951 Secondary malignant neoplasm of bone: Secondary | ICD-10-CM | POA: Diagnosis not present

## 2020-10-20 ENCOUNTER — Ambulatory Visit
Admission: RE | Admit: 2020-10-20 | Discharge: 2020-10-20 | Disposition: A | Discharge status: Home / Self Care | Payer: Medicare HMO | Source: Ambulatory Visit | Attending: Radiation Oncology | Admitting: Radiation Oncology

## 2020-10-20 DIAGNOSIS — C7951 Secondary malignant neoplasm of bone: Secondary | ICD-10-CM | POA: Diagnosis not present

## 2020-10-24 ENCOUNTER — Inpatient Hospital Stay: Payer: Medicare HMO

## 2020-10-24 ENCOUNTER — Encounter: Payer: Self-pay | Admitting: Internal Medicine

## 2020-10-24 ENCOUNTER — Other Ambulatory Visit (HOSPITAL_COMMUNITY): Payer: Self-pay

## 2020-10-24 ENCOUNTER — Inpatient Hospital Stay: Payer: Medicare HMO | Admitting: Internal Medicine

## 2020-10-24 ENCOUNTER — Other Ambulatory Visit: Payer: Self-pay

## 2020-10-24 DIAGNOSIS — C50912 Malignant neoplasm of unspecified site of left female breast: Secondary | ICD-10-CM

## 2020-10-24 DIAGNOSIS — C7951 Secondary malignant neoplasm of bone: Secondary | ICD-10-CM | POA: Diagnosis not present

## 2020-10-24 LAB — COMPREHENSIVE METABOLIC PANEL
ALT: 20 U/L (ref 0–44)
AST: 22 U/L (ref 15–41)
Albumin: 3.8 g/dL (ref 3.5–5.0)
Alkaline Phosphatase: 101 U/L (ref 38–126)
Anion gap: 14 (ref 5–15)
BUN: 15 mg/dL (ref 8–23)
CO2: 20 mmol/L — ABNORMAL LOW (ref 22–32)
Calcium: 9.5 mg/dL (ref 8.9–10.3)
Chloride: 102 mmol/L (ref 98–111)
Creatinine, Ser: 0.78 mg/dL (ref 0.44–1.00)
GFR, Estimated: >60 mL/min (ref >60)
Glucose, Bld: 115 mg/dL — ABNORMAL HIGH (ref 70–99)
Potassium: 3.7 mmol/L (ref 3.5–5.1)
Sodium: 136 mmol/L (ref 135–145)
Total Bilirubin: 0.5 mg/dL (ref 0.3–1.2)
Total Protein: 7.1 g/dL (ref 6.5–8.1)

## 2020-10-24 LAB — CBC WITH DIFFERENTIAL/PLATELET
Abs Immature Granulocytes: 0.04 10*3/uL (ref 0.00–0.07)
Basophils Absolute: 0 10*3/uL (ref 0.0–0.1)
Basophils Relative: 1 %
Eosinophils Absolute: 0.1 10*3/uL (ref 0.0–0.5)
Eosinophils Relative: 1 %
HCT: 40.3 % (ref 36.0–46.0)
Hemoglobin: 13.3 g/dL (ref 12.0–15.0)
Immature Granulocytes: 1 %
Lymphocytes Relative: 15 %
Lymphs Abs: 1 10*3/uL (ref 0.7–4.0)
MCH: 29.1 pg (ref 26.0–34.0)
MCHC: 33 g/dL (ref 30.0–36.0)
MCV: 88.2 fL (ref 80.0–100.0)
Monocytes Absolute: 1 10*3/uL (ref 0.1–1.0)
Monocytes Relative: 15 %
Neutro Abs: 4.6 10*3/uL (ref 1.7–7.7)
Neutrophils Relative %: 67 %
Platelets: 191 10*3/uL (ref 150–400)
RBC: 4.57 MIL/uL (ref 3.87–5.11)
RDW: 14.4 % (ref 11.5–15.5)
WBC: 6.8 10*3/uL (ref 4.0–10.5)
nRBC: 0 % (ref 0.0–0.2)

## 2020-10-24 NOTE — Assessment & Plan Note (Addendum)
#  recurrent metastatic breast cancer to the bone-ER/PR-positive; HER2/neu-NEG. July 2022 PET Widespread hypermetabolic mixed lytic and sclerotic bone metastases throughout the axial and proximal appendicular skeleton, Mildly enlarged right paratracheal lymph node with low-level hypermetabolism, indeterminate for nodal metastasis; Scattered subcentimeter bilateral pulmonary nodules, largest 0.9 cm in the peripheral left lower lobe. NGS/F-ONE: PICK3 mutation was discussed further lines of therapy.  # Patient currently on Aromasin-currently tolerating well.  However in general given patient's poor tolerance to chemo/pills; I would recommend adding CDK inhibitor/palbociclib in the 1 month or so.  # Nausea- G-1 sec to aromasin; continue Zofran as needed.  # Metastatic bone disease/right hip pathologic fracture status post ORIF-s/p  Zometa- on ca+vit D pill [gummy]- tylenol-ibuprofen BID.   # Pneumonia LLL-July 2022 chest x-ray reviewed on z-pack/ prednisone [no fevers; cough pleuritic pain]- improved.   # NGS: chek-2 mutation- genetic counseling.   # DISPOSITION: # will follow up in 2 weeks; MD Labs- cbc/cmp; possible Zometa-Dr.B

## 2020-10-24 NOTE — Progress Notes (Signed)
[facility] CONSULT NOTE  Patient Care Team: Dion Body, MD as PCP - General (Family Medicine)  CHIEF COMPLAINTS/PURPOSE OF CONSULTATION: Breast cancer  #  Oncology History Overview Note  # history of breast cancer on the left side in 2003-s/p lumpectomy followed by chemotherapy and radiation.  As per patient reports-stage II [approximate 2 cm primary tumor; 21 lymph nodes negative; reports receiving "red chemo".  Unaware if HER2 positive] as per patient she was started on tamoxifen-.  1 year because of intolerance.  Patient states to have lump in the same breast which was again concerning for malignancy but subsequently proven benign.  However patient underwent bilateral prophylactic mastectomy with reconstruction.  Patient's oncologist was Dr. Anastasia Pall in [place], [place].   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 > 90%/PR-NEG HER2 +1/NEGATIVE  # NGS/MOLECULAR TESTS: PIC3K*    # PALLIATIVE CARE EVALUATION:  # PAIN MANAGEMENT:    DIAGNOSIS: RECURRENT BREAST CANCER  STAGE:  IV       ;  GOALS: palliative care  CURRENT/MOST RECENT THERAPY : Anastrazole    Breast cancer metastasized to bone, left ([place])  09/12/2020 Initial Diagnosis   Breast cancer metastasized to bone, left ([place])   09/18/2020 Cancer Staging   Staging form: Breast, AJCC 8th Edition - Clinical: Stage IV (cTX, cNX, cM1, GX, ER+, PR-, HER2-) - Signed by Cammie Sickle, MD on 09/18/2020  Histologic grading system: 3 grade system       HISTORY OF PRESENTING ILLNESS:  Melanie Malone 75 y.o.  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  Patient has been on Aromasin given intolerance to other AI.  Patient had 1 episode of nausea since being on Aromasin improved with Zofran.  Otherwise denies any headaches but denies any denies any chest pain  or shortness of breath or cough.   Patient is s/p radiation.  Feels significant improved.   Review of Systems  Constitutional:  Positive for malaise/fatigue. Negative for chills, diaphoresis, fever and weight loss.  HENT:  Negative for nosebleeds and sore throat.   Eyes:  Negative for double vision.  Respiratory:  Negative for cough, hemoptysis, sputum production, shortness of breath and wheezing.   Cardiovascular:  Negative for chest pain, palpitations, orthopnea and leg swelling.  Gastrointestinal:  Negative for abdominal pain, blood in stool, constipation, diarrhea, heartburn, melena, nausea and vomiting.  Genitourinary:  Negative for dysuria, frequency and urgency.  Musculoskeletal:  Positive for joint pain. Negative for back pain.  Skin: Negative.  Negative for itching and rash.  Neurological:  Negative for dizziness, tingling, focal weakness, weakness and headaches.  Endo/Heme/Allergies:  Does not bruise/bleed easily.  Psychiatric/Behavioral:  Negative for depression. The patient is not nervous/anxious and does not have insomnia.     MEDICAL HISTORY:  Past Medical History:  Diagnosis Date   Arthritis    knees, lower back   Breast CA ([place])    Hyperlipidemia    Hypertension    PONV (postoperative nausea and vomiting)     SURGICAL HISTORY: Past Surgical History:  Procedure Laterality Date   ABDOMINAL HYSTERECTOMY     BREAST LUMPECTOMY     BREAST RECONSTRUCTION Bilateral    BUNIONECTOMY Right    CATARACT EXTRACTION W/PHACO Left 01/14/2020   Procedure: CATARACT EXTRACTION PHACO AND INTRAOCULAR LENS PLACEMENT ([facility] [facility]) LEFT;  Surgeon: Eulogio Bear, MD;  Location: [facility];  Service: Ophthalmology;  Laterality: Left;  6.92 0:54.0   CATARACT EXTRACTION W/PHACO Right 02/11/2020   Procedure: CATARACT EXTRACTION PHACO AND INTRAOCULAR LENS PLACEMENT ([facility]) RIGHT 13.20 01:13.6;  Surgeon: Eulogio Bear, MD;  Location: [facility];  Service: Ophthalmology;   Laterality: Right;   INTRAMEDULLARY (IM) NAIL INTERTROCHANTERIC Right 08/28/2020   Procedure: INTRAMEDULLARY (IM) NAIL INTERTROCHANTRIC;  Surgeon: Hessie Knows, MD;  Location: [facility];  Service: Orthopedics;  Laterality: Right;   MASTECTOMY Bilateral     SOCIAL HISTORY: Social History   Socioeconomic History   Marital status: Married    Spouse name: Not on file   Number of children: Not on file   Years of education: Not on file   Highest education level: Not on file  Occupational History   Not on file  Tobacco Use   Smoking status: Never   Smokeless tobacco: Never  Vaping Use   Vaping Use: Never used  Substance and Sexual Activity   Alcohol use: Not Currently   Drug use: Not on file   Sexual activity: Not on file  Other Topics Concern   Not on file  Social History Narrative   Patient moved from [place] about 5 years ago to live closer to her husband's family.  Husband with scleroderma/caregiver.  Used to work in [employer] (medical).  No smoking.  No alcohol abuse.  Patient's daughter lives in [place] and [place]   Social Determinants of Health   Financial Resource Strain: Not on file  Food Insecurity: Not on file  Transportation Needs: Not on file  Physical Activity: Not on file  Stress: Not on file  Social Connections: Not on file  Intimate Partner Violence: Not on file    FAMILY HISTORY: Family History  Problem Relation Age of Onset   Hypertension Mother     ALLERGIES:  is allergic to codeine, scallops [shellfish allergy], vicodin hp [hydrocodone-acetaminophen], other, and red dye.  MEDICATIONS:  Current Outpatient Medications  Medication Sig Dispense Refill   acetaminophen (TYLENOL) 500 MG tablet Take 500 mg by mouth 2 (two) times daily as needed.     calcium citrate-vitamin D (CITRACAL+D) 315-200 MG-UNIT tablet Take 1 tablet by mouth 2 (two) times daily.     exemestane (AROMASIN) 25 MG tablet Take 1 tablet (25 mg total) by mouth daily after breakfast. 30 tablet 3    hydrochlorothiazide (HYDRODIURIL) 25 MG tablet Take 25 mg by mouth daily.     ibuprofen (ADVIL) 200 MG tablet Take 600 mg by mouth every 6 (six) hours as needed for moderate pain.     lisinopril (ZESTRIL) 20 MG tablet Take 20 mg by mouth daily.     simvastatin (ZOCOR) 40 MG tablet Take 40 mg by mouth daily.     vitamin B-12 (CYANOCOBALAMIN) 500 MCG tablet Take 500 mcg by mouth daily.     Vitamin D3 (VITAMIN D) 25 MCG tablet Take 1,000 Units by mouth daily.     ondansetron (ZOFRAN) 8 MG tablet Take 1 tablet (8 mg total) by mouth every 8 (eight) hours as needed for nausea or vomiting. (Patient not taking: Reported on 10/24/2020) 30 tablet 0   traMADol (ULTRAM) 50 MG tablet Take 1 tablet (50 mg total) by mouth every 6 (six) hours as needed for moderate pain. (Patient not taking: Reported on 10/24/2020) 30 tablet 0   No current facility-administered medications for this visit.      [facility] Kitchen  PHYSICAL EXAMINATION: ECOG PERFORMANCE STATUS: 1 - Symptomatic but completely ambulatory  Vitals:   10/24/20 0945  BP:  123/87  Pulse: 83  Resp: 18  Temp: (!) 96.7 F (35.9 C)   There were no vitals filed for this visit.   Physical Exam Vitals and nursing note reviewed.  Constitutional:      Comments: Ambulating:  with a cane [recent hip surgery] Husband.   HENT:     Head: Normocephalic and atraumatic.     Mouth/Throat:     Pharynx: Oropharynx is clear.  Eyes:     Extraocular Movements: Extraocular movements intact.     Pupils: Pupils are equal, round, and reactive to light.  Cardiovascular:     Rate and Rhythm: Normal rate and regular rhythm.  Pulmonary:     Effort: Pulmonary effort is normal.     Breath sounds: Normal breath sounds.  Abdominal:     Palpations: Abdomen is soft.  Musculoskeletal:        General: Normal range of motion.     Cervical back: Normal range of motion.  Skin:    General: Skin is warm.  Neurological:     General: No focal deficit present.     Mental Status: She is  alert and oriented to person, place, and time.  Psychiatric:        Behavior: Behavior normal.        Judgment: Judgment normal.     LABORATORY DATA:  I have reviewed the data as listed Lab Results  Component Value Date   WBC 6.8 10/24/2020   HGB 13.3 10/24/2020   HCT 40.3 10/24/2020   MCV 88.2 10/24/2020   PLT 191 10/24/2020   Recent Labs    09/12/20 1436 10/10/20 1016 10/24/20 0904  NA 132* 133* 136  K 3.8 3.5 3.7  CL 94* 97* 102  CO2 25 24 20*  GLUCOSE 104* 107* 115*  BUN '15 16 15  ' CREATININE 0.59 0.76 0.78  CALCIUM 9.6 8.8* 9.5  GFRNONAA >60 >60 >60  PROT 7.5 7.5 7.1  ALBUMIN 3.9 3.5 3.8  AST 20 45* 22  ALT 26 61* 20  ALKPHOS 160* 164* 101  BILITOT 0.6 0.6 0.5    RADIOGRAPHIC STUDIES: I have personally reviewed the radiological images as listed and agreed with the findings in the report. DG Chest 2 View  Result Date: 10/07/2020 CLINICAL DATA:  Shortness of breath for 3 days. EXAM: CHEST - 2 VIEW COMPARISON:  08/27/2020 FINDINGS: Heart size and pulmonary vascularity are normal. Elevation of the left hemidiaphragm with interval development of consolidation in the left lung base, most likely pneumonia. Right lung is clear. Possible small left pleural effusion. No pneumothorax. Degenerative changes in the spine and shoulders. Bilateral breast implants. IMPRESSION: Consolidation in the left lung base with probable small left pleural effusion. Changes likely to represent pneumonia. Electronically Signed   By: Lucienne Capers M.D.   On: 10/07/2020 23:57    ASSESSMENT & PLAN:   Breast cancer metastasized to bone, left ([place]) # recurrent metastatic breast cancer to the bone-ER/PR-positive; HER2/neu-NEG. July 2022 PET Widespread hypermetabolic mixed lytic and sclerotic bone metastases throughout the axial and proximal appendicular skeleton, Mildly enlarged right paratracheal lymph node with low-level hypermetabolism, indeterminate for nodal metastasis; Scattered subcentimeter  bilateral pulmonary nodules, largest 0.9 cm in the peripheral left lower lobe. NGS/F-ONE: PICK3 mutation was discussed further lines of therapy.  # Patient currently on Aromasin-currently tolerating well.  However in general given patient's poor tolerance to chemo/pills; I would recommend adding CDK inhibitor/palbociclib in the 1 month or so.  # Nausea- G-1 sec to aromasin;  continue Zofran as needed.  # Metastatic bone disease/right hip pathologic fracture status post ORIF-s/p  Zometa- on ca+vit D pill [gummy]- tylenol-ibuprofen BID.   # Pneumonia LLL-July 2022 chest x-ray reviewed on z-pack/ prednisone [no fevers; cough pleuritic pain]- improved.   # NGS: chek-2 mutation- genetic counseling.   # DISPOSITION: # will follow up in 2 weeks; MD Labs- cbc/cmp; possible Zometa-Dr.B  All questions were answered. The patient knows to call the clinic with any problems, questions or concerns.    Cammie Sickle, MD 10/26/2020 3:37 PM

## 2020-10-25 ENCOUNTER — Telehealth: Payer: Self-pay | Admitting: Oncology

## 2020-10-25 NOTE — Telephone Encounter (Signed)
RE: Death notification  Received phone call from [facility] that Melanie Malone had passed away this evening.  Dr. Rogue Bussing will sign death certificate.   Faythe Casa, NP 10/09/2020 11:36 PM

## 2020-10-26 ENCOUNTER — Encounter: Payer: Self-pay | Admitting: Internal Medicine

## 2020-10-26 ENCOUNTER — Encounter: Payer: Self-pay | Admitting: Radiation Oncology

## 2020-10-26 ENCOUNTER — Telehealth: Payer: Self-pay | Admitting: Internal Medicine

## 2020-10-26 NOTE — Telephone Encounter (Signed)
On 7/24-I called patient's daughter Cindy-offered my condolences the unexpected passing away of the patient.  Thankful for the call.  Also discussed regarding genetic testing/screening of the patient's children [daughter and 2 sons] given the presence of CHEK-2 mutation noted on foundation 1 testing.  Discussed the difference between genetic mutation versus somatic mutation.    Cindy verbalizes the understanding of importance of above follow-up with genetic counselor/screening.  She will inform her rest of the siblings.  GB

## 2020-11-03 DEATH — deceased

## 2020-11-07 ENCOUNTER — Ambulatory Visit: Payer: Medicare HMO

## 2020-11-07 ENCOUNTER — Other Ambulatory Visit: Payer: Medicare HMO

## 2020-11-07 ENCOUNTER — Ambulatory Visit: Payer: Medicare HMO | Admitting: Internal Medicine

## 2020-11-24 ENCOUNTER — Ambulatory Visit: Payer: Medicare HMO | Admitting: Radiation Oncology

## 2021-12-12 IMAGING — CT CT ABD-PELV W/O CM
2 of 4 series · 16 of 46 positions shown, 18 images · non-contrast
Comparison: Partial comparison to CT chest dated 08/27/2020

CLINICAL DATA: Breast cancer, staging

EXAM:
CT ABDOMEN AND PELVIS WITHOUT CONTRAST
TECHNIQUE: Multidetector CT imaging of the abdomen and pelvis was performed
following the standard protocol without IV contrast.

[Series 2: routine abd/pel wo · axial · 0.94mm/px · z∈[-1270,-805]mm · 13 of 103 slices shown, 15 images]
[im 5/103  soft-tissue]
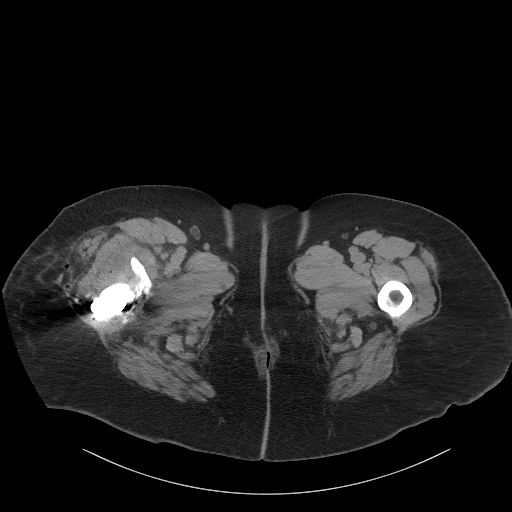
[im 5/103  bone]
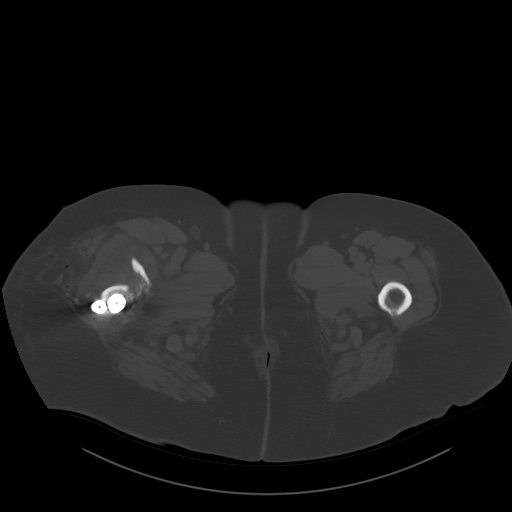
[im 13/103  soft-tissue]
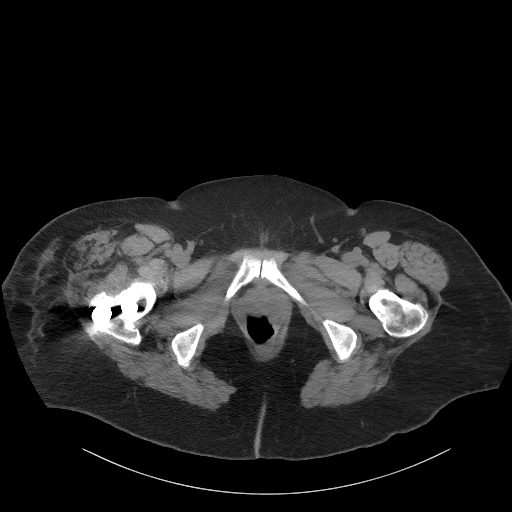
[im 22/103  soft-tissue]
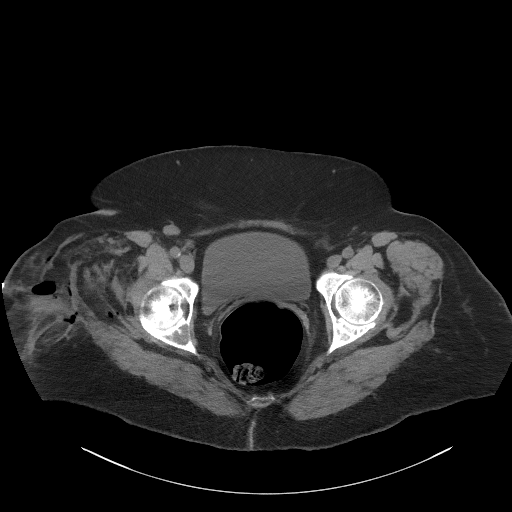
[im 30/103  soft-tissue]
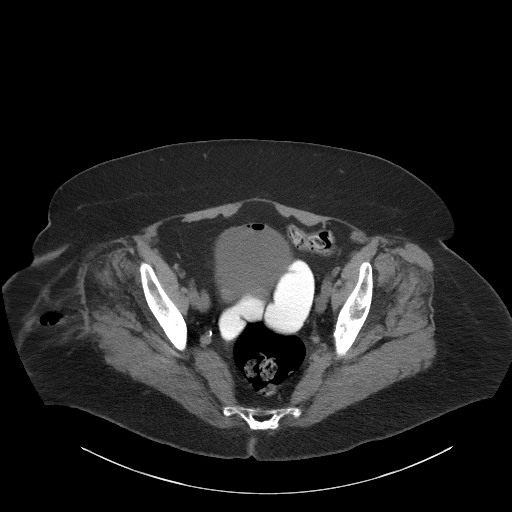
[im 35/103  soft-tissue]
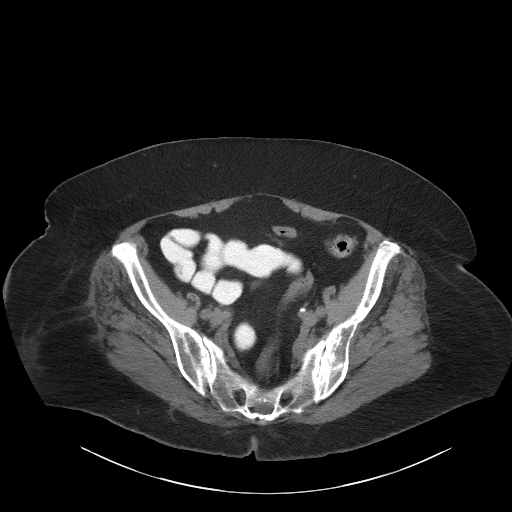
[im 43/103  soft-tissue]
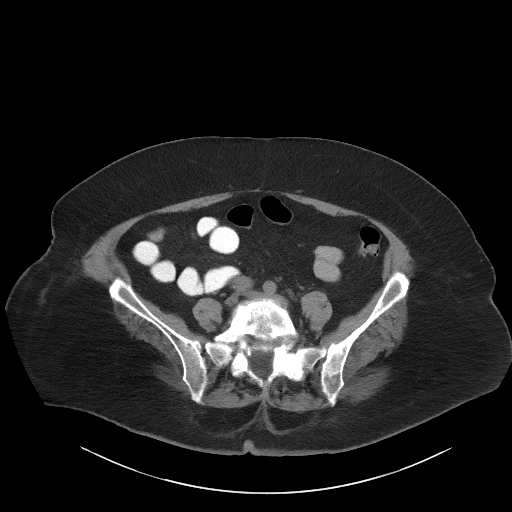
[im 52/103  soft-tissue]
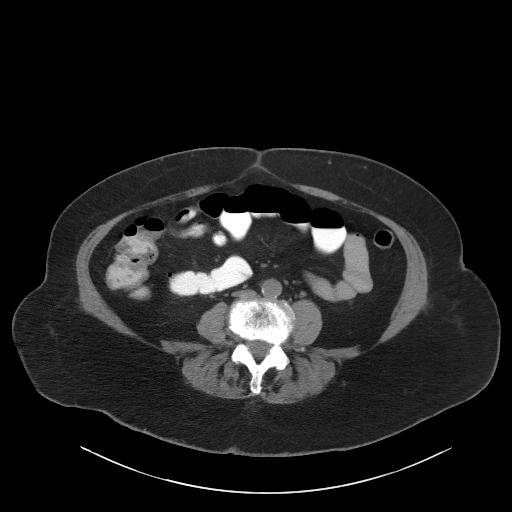
[im 60/103  soft-tissue]
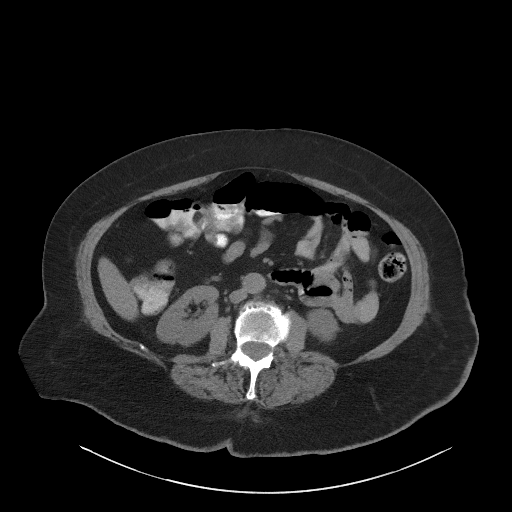
[im 69/103  soft-tissue]
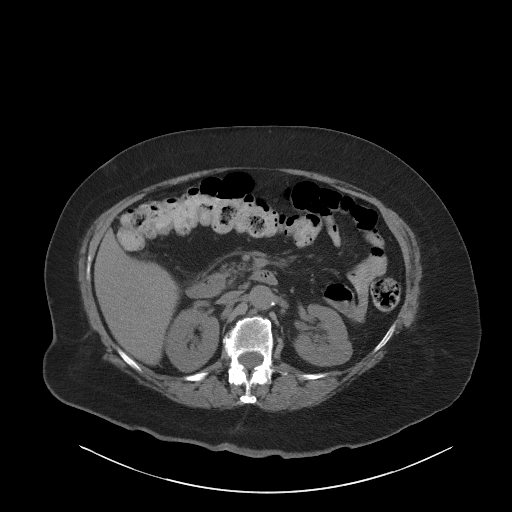
[im 69/103  bone]
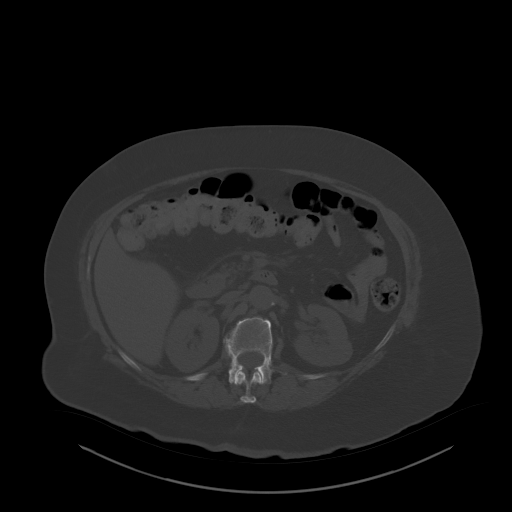
[im 73/103  soft-tissue]
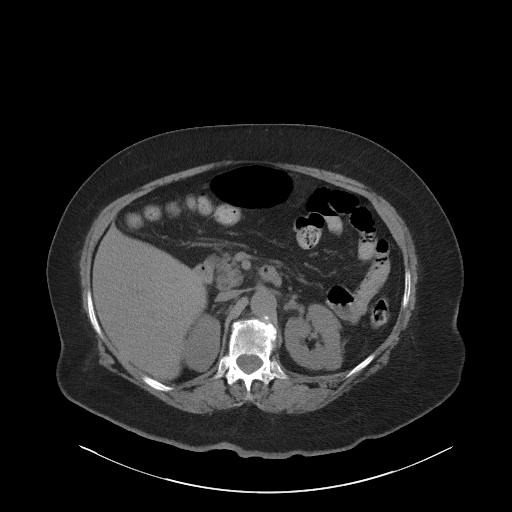
[im 81/103  soft-tissue]
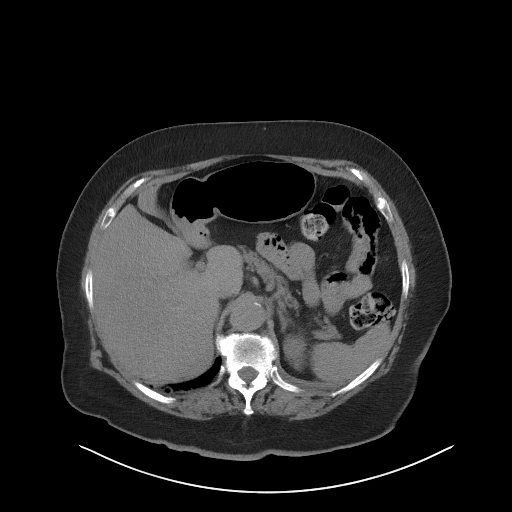
[im 90/103  soft-tissue]
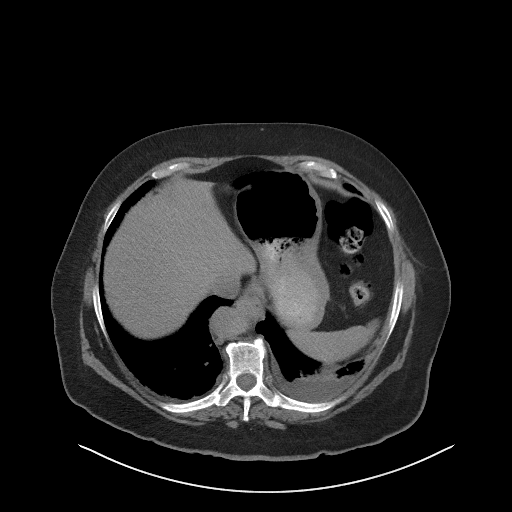
[im 98/103  soft-tissue]
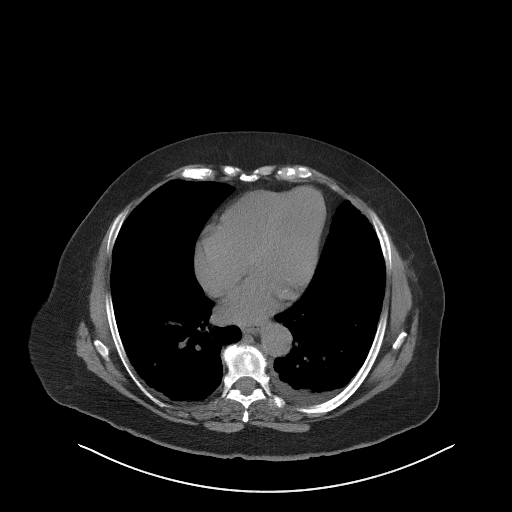

[Series 5: coronal st · coronal · 0.95mm/px · 3 of 96 slices shown]
[im 32/96  soft-tissue]
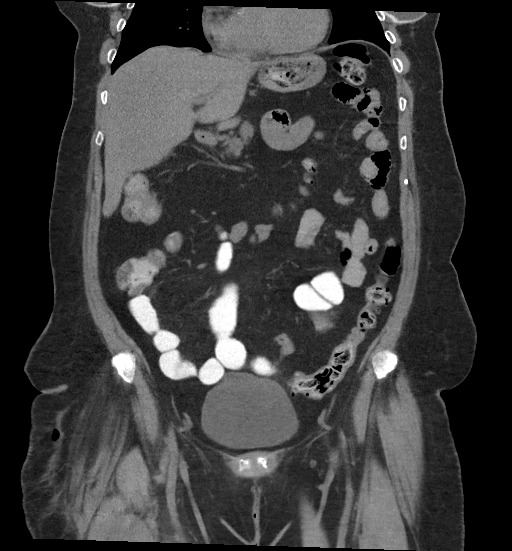
[im 43/96  soft-tissue]
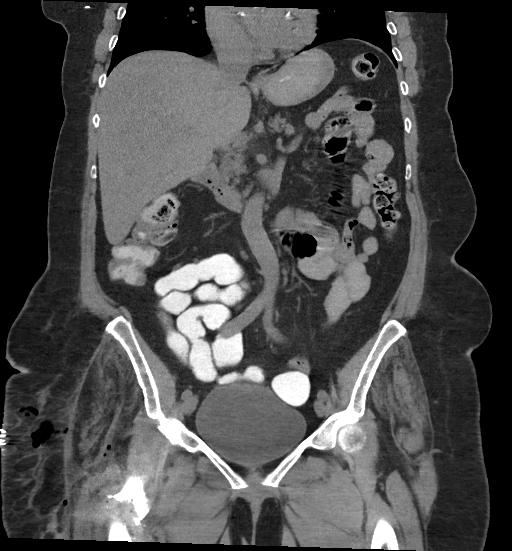
[im 53/96  soft-tissue]
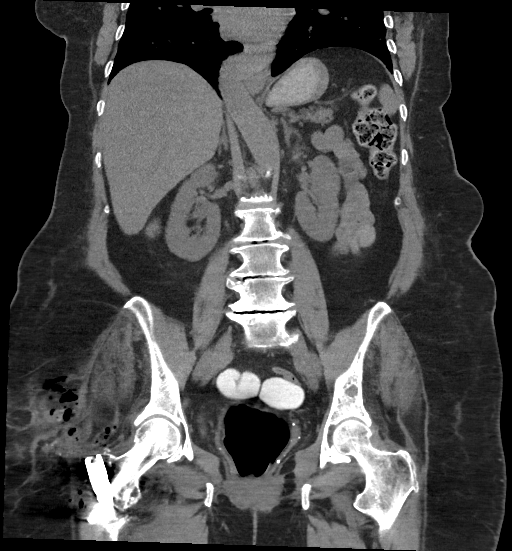

[16 of 46 positions shown; findings below may reference images not displayed]

FINDINGS: Lower chest: Small left pleural effusion with associated left
basilar atelectasis. Trace right pleural fluid.

Hepatobiliary: Unenhanced liver is grossly unremarkable.

Status post cholecystectomy. No intrahepatic or extrahepatic ductal
dilatation.

Pancreas: Within normal limits.

Spleen: Within normal limits.

Adrenals/Urinary Tract: Adrenal glands are within normal limits.

Kidneys are within normal limits. No renal calculi or
hydronephrosis.

Trace nondependent gas in the bladder (series 2/image 36), likely
related to instrumentation.

Stomach/Bowel: Stomach is notable for a tiny hiatal hernia.

No evidence of bowel obstruction.

Normal appendix (series 2/image 51).

Vascular/Lymphatic: No evidence of abdominal aortic aneurysm.

Atherosclerotic calcifications of the abdominal aorta and branch
vessels.

No suspicious abdominopelvic lymphadenopathy.

Reproductive: Status post hysterectomy.

No adnexal masses.

Other: No abdominopelvic ascites.

Musculoskeletal: Bilateral breast augmentation, incompletely
visualized.

Grade 1 spondylolisthesis at L5-S1. Degenerative changes of the
visualized thoracolumbar spine.

Postprocedural changes related to ORIF of the right hip.
Lytic/sclerotic lesions involving the bilateral iliac bones (series
2/images 67 and 71) and the right proximal femur (series 2/image
97), raising concern for metastases given the clinical history.
IMPRESSION: Lytic/sclerotic lesions involving the pelvis, raising concern for
osseous metastases given the clinical history of breast cancer.

Otherwise, no findings suspicious for metastatic disease in the
abdomen/pelvis on unenhanced CT.

Small left pleural effusion with associated left lower lobe
atelectasis.
